# Patient Record
Sex: Female | Born: 1960 | Race: White | Hispanic: No | Marital: Married | Smoking: Former smoker
Health system: Southern US, Community
[De-identification: ages and names within clinical notes are randomized; demographics above are authoritative.]

## PROBLEM LIST (undated history)

## (undated) DIAGNOSIS — I10 Essential (primary) hypertension: Secondary | ICD-10-CM

## (undated) HISTORY — DX: Essential (primary) hypertension: I10

---

## 1997-10-15 ENCOUNTER — Other Ambulatory Visit: Admission: RE | Admit: 1997-10-15 | Discharge: 1997-10-15 | Payer: Self-pay | Admitting: Obstetrics and Gynecology

## 1998-11-20 ENCOUNTER — Other Ambulatory Visit: Admission: RE | Admit: 1998-11-20 | Discharge: 1998-11-20 | Payer: Self-pay | Admitting: Obstetrics and Gynecology

## 1999-02-06 ENCOUNTER — Other Ambulatory Visit: Admission: RE | Admit: 1999-02-06 | Discharge: 1999-02-06 | Payer: Self-pay | Admitting: Obstetrics and Gynecology

## 1999-12-21 ENCOUNTER — Other Ambulatory Visit: Admission: RE | Admit: 1999-12-21 | Discharge: 1999-12-21 | Payer: Self-pay | Admitting: Obstetrics and Gynecology

## 2001-03-08 ENCOUNTER — Other Ambulatory Visit: Admission: RE | Admit: 2001-03-08 | Discharge: 2001-03-08 | Payer: Self-pay | Admitting: Obstetrics and Gynecology

## 2002-03-26 ENCOUNTER — Other Ambulatory Visit: Admission: RE | Admit: 2002-03-26 | Discharge: 2002-03-26 | Payer: Self-pay | Admitting: Obstetrics and Gynecology

## 2003-04-03 ENCOUNTER — Other Ambulatory Visit: Admission: RE | Admit: 2003-04-03 | Discharge: 2003-04-03 | Payer: Self-pay | Admitting: Obstetrics and Gynecology

## 2004-02-10 ENCOUNTER — Encounter: Admission: RE | Admit: 2004-02-10 | Discharge: 2004-02-10 | Payer: Self-pay | Admitting: Obstetrics and Gynecology

## 2004-04-28 ENCOUNTER — Ambulatory Visit: Payer: Self-pay | Admitting: Family Medicine

## 2004-12-29 ENCOUNTER — Ambulatory Visit: Payer: Self-pay | Admitting: Family Medicine

## 2005-02-19 ENCOUNTER — Encounter: Admission: RE | Admit: 2005-02-19 | Discharge: 2005-02-19 | Payer: Self-pay | Admitting: Obstetrics and Gynecology

## 2005-03-04 ENCOUNTER — Encounter: Admission: RE | Admit: 2005-03-04 | Discharge: 2005-03-04 | Payer: Self-pay | Admitting: Gastroenterology

## 2005-03-10 ENCOUNTER — Encounter: Admission: RE | Admit: 2005-03-10 | Discharge: 2005-03-10 | Payer: Self-pay | Admitting: Gastroenterology

## 2006-01-26 ENCOUNTER — Ambulatory Visit: Payer: Self-pay | Admitting: Family Medicine

## 2006-02-22 ENCOUNTER — Encounter: Admission: RE | Admit: 2006-02-22 | Discharge: 2006-02-22 | Payer: Self-pay | Admitting: Obstetrics and Gynecology

## 2006-04-14 ENCOUNTER — Ambulatory Visit: Payer: Self-pay | Admitting: Family Medicine

## 2006-04-14 LAB — CONVERTED CEMR LAB
ALT: 22 units/L (ref 0–40)
AST: 22 units/L (ref 0–37)
Albumin: 4.1 g/dL (ref 3.5–5.2)
Alkaline Phosphatase: 33 units/L — ABNORMAL LOW (ref 39–117)
BUN: 9 mg/dL (ref 6–23)
Basophils Absolute: 0 10*3/uL (ref 0.0–0.1)
Basophils Relative: 0.4 % (ref 0.0–1.0)
CO2: 29 meq/L (ref 19–32)
Calcium: 9.3 mg/dL (ref 8.4–10.5)
Chloride: 100 meq/L (ref 96–112)
Chol/HDL Ratio, serum: 3.3
Cholesterol: 215 mg/dL (ref 0–200)
Creatinine, Ser: 1 mg/dL (ref 0.4–1.2)
Eosinophil percent: 6.6 % — ABNORMAL HIGH (ref 0.0–5.0)
GFR calc non Af Amer: 63 mL/min
Glomerular Filtration Rate, Af Am: 77 mL/min/{1.73_m2}
Glucose, Bld: 83 mg/dL (ref 70–99)
HCT: 42 % (ref 36.0–46.0)
HDL: 65.2 mg/dL (ref >39.0)
Hemoglobin: 14.5 g/dL (ref 12.0–15.0)
LDL DIRECT: 135.6 mg/dL
Lymphocytes Relative: 34.4 % (ref 12.0–46.0)
MCHC: 34.5 g/dL (ref 30.0–36.0)
MCV: 95.1 fL (ref 78.0–100.0)
Monocytes Absolute: 0.4 10*3/uL (ref 0.2–0.7)
Monocytes Relative: 6.7 % (ref 3.0–11.0)
Neutro Abs: 3.4 10*3/uL (ref 1.4–7.7)
Neutrophils Relative %: 51.9 % (ref 43.0–77.0)
Platelets: 362 10*3/uL (ref 150–400)
Potassium: 4.2 meq/L (ref 3.5–5.1)
RBC: 4.42 M/uL (ref 3.87–5.11)
RDW: 12 % (ref 11.5–14.6)
Sodium: 136 meq/L (ref 135–145)
TSH: 2.9 microintl units/mL (ref 0.35–5.50)
Total Bilirubin: 1.1 mg/dL (ref 0.3–1.2)
Total Protein: 7.6 g/dL (ref 6.0–8.3)
Triglyceride fasting, serum: 132 mg/dL (ref 0–149)
VLDL: 26 mg/dL (ref 0–40)
WBC: 6.4 10*3/uL (ref 4.5–10.5)

## 2007-02-08 ENCOUNTER — Ambulatory Visit: Payer: Self-pay | Admitting: Family Medicine

## 2007-02-08 DIAGNOSIS — J029 Acute pharyngitis, unspecified: Secondary | ICD-10-CM

## 2007-02-08 LAB — CONVERTED CEMR LAB
Heterophile Ab Screen: NEGATIVE
Rapid Strep: NEGATIVE

## 2007-02-09 ENCOUNTER — Encounter: Payer: Self-pay | Admitting: Family Medicine

## 2007-02-24 ENCOUNTER — Encounter: Admission: RE | Admit: 2007-02-24 | Discharge: 2007-02-24 | Payer: Self-pay | Admitting: Obstetrics and Gynecology

## 2007-05-05 ENCOUNTER — Ambulatory Visit: Payer: Self-pay | Admitting: Family Medicine

## 2007-05-05 DIAGNOSIS — M549 Dorsalgia, unspecified: Secondary | ICD-10-CM | POA: Insufficient documentation

## 2007-11-21 ENCOUNTER — Telehealth (INDEPENDENT_AMBULATORY_CARE_PROVIDER_SITE_OTHER): Payer: Self-pay | Admitting: *Deleted

## 2007-11-21 ENCOUNTER — Ambulatory Visit: Payer: Self-pay | Admitting: Internal Medicine

## 2007-11-21 DIAGNOSIS — M5430 Sciatica, unspecified side: Secondary | ICD-10-CM

## 2007-11-21 DIAGNOSIS — M545 Low back pain: Secondary | ICD-10-CM

## 2007-11-21 DIAGNOSIS — S35406A Unspecified injury of unspecified renal vein, initial encounter: Secondary | ICD-10-CM

## 2007-12-15 ENCOUNTER — Telehealth (INDEPENDENT_AMBULATORY_CARE_PROVIDER_SITE_OTHER): Payer: Self-pay | Admitting: *Deleted

## 2008-02-26 ENCOUNTER — Encounter: Admission: RE | Admit: 2008-02-26 | Discharge: 2008-02-26 | Payer: Self-pay | Admitting: Obstetrics and Gynecology

## 2008-03-06 ENCOUNTER — Encounter: Admission: RE | Admit: 2008-03-06 | Discharge: 2008-03-06 | Payer: Self-pay | Admitting: Obstetrics and Gynecology

## 2009-02-04 ENCOUNTER — Telehealth (INDEPENDENT_AMBULATORY_CARE_PROVIDER_SITE_OTHER): Payer: Self-pay | Admitting: *Deleted

## 2009-02-10 ENCOUNTER — Encounter (INDEPENDENT_AMBULATORY_CARE_PROVIDER_SITE_OTHER): Payer: Self-pay | Admitting: *Deleted

## 2009-02-17 ENCOUNTER — Ambulatory Visit: Payer: Self-pay | Admitting: Family Medicine

## 2009-02-17 DIAGNOSIS — K219 Gastro-esophageal reflux disease without esophagitis: Secondary | ICD-10-CM

## 2009-02-17 DIAGNOSIS — I10 Essential (primary) hypertension: Secondary | ICD-10-CM | POA: Insufficient documentation

## 2009-02-17 DIAGNOSIS — J45909 Unspecified asthma, uncomplicated: Secondary | ICD-10-CM

## 2009-03-03 ENCOUNTER — Ambulatory Visit: Payer: Self-pay | Admitting: Family Medicine

## 2009-03-04 ENCOUNTER — Encounter: Admission: RE | Admit: 2009-03-04 | Discharge: 2009-03-04 | Payer: Self-pay | Admitting: Obstetrics and Gynecology

## 2009-03-10 ENCOUNTER — Ambulatory Visit: Payer: Self-pay | Admitting: Family Medicine

## 2009-03-10 LAB — CONVERTED CEMR LAB
ALT: 14 units/L (ref 0–35)
AST: 17 units/L (ref 0–37)
Albumin: 3.9 g/dL (ref 3.5–5.2)
Alkaline Phosphatase: 52 units/L (ref 39–117)
BUN: 11 mg/dL (ref 6–23)
Basophils Absolute: 0 10*3/uL (ref 0.0–0.1)
Basophils Relative: 0.4 % (ref 0.0–3.0)
Bilirubin, Direct: 0 mg/dL (ref 0.0–0.3)
CO2: 28 meq/L (ref 19–32)
Calcium: 8.8 mg/dL (ref 8.4–10.5)
Chloride: 107 meq/L (ref 96–112)
Cholesterol: 179 mg/dL (ref 0–200)
Creatinine, Ser: 0.9 mg/dL (ref 0.4–1.2)
Eosinophils Absolute: 0.5 10*3/uL (ref 0.0–0.7)
Eosinophils Relative: 7.8 % — ABNORMAL HIGH (ref 0.0–5.0)
GFR calc non Af Amer: 70.58 mL/min (ref >60)
Glucose, Bld: 86 mg/dL (ref 70–99)
HCT: 38.9 % (ref 36.0–46.0)
HDL: 59 mg/dL (ref >39.00)
Hemoglobin: 13.4 g/dL (ref 12.0–15.0)
LDL Cholesterol: 104 mg/dL — ABNORMAL HIGH (ref 0–99)
Lymphocytes Relative: 38.3 % (ref 12.0–46.0)
Lymphs Abs: 2.3 10*3/uL (ref 0.7–4.0)
MCHC: 34.6 g/dL (ref 30.0–36.0)
MCV: 96.2 fL (ref 78.0–100.0)
Monocytes Absolute: 0.4 10*3/uL (ref 0.1–1.0)
Monocytes Relative: 6.9 % (ref 3.0–12.0)
Neutro Abs: 2.8 10*3/uL (ref 1.4–7.7)
Neutrophils Relative %: 46.6 % (ref 43.0–77.0)
Platelets: 338 10*3/uL (ref 150.0–400.0)
Potassium: 4.1 meq/L (ref 3.5–5.1)
RBC: 4.04 M/uL (ref 3.87–5.11)
RDW: 12.8 % (ref 11.5–14.6)
Sodium: 139 meq/L (ref 135–145)
TSH: 1.76 microintl units/mL (ref 0.35–5.50)
Total Bilirubin: 0.8 mg/dL (ref 0.3–1.2)
Total CHOL/HDL Ratio: 3
Total Protein: 7.5 g/dL (ref 6.0–8.3)
Triglycerides: 78 mg/dL (ref 0.0–149.0)
VLDL: 15.6 mg/dL (ref 0.0–40.0)
WBC: 6 10*3/uL (ref 4.5–10.5)

## 2009-08-05 ENCOUNTER — Ambulatory Visit: Payer: Self-pay | Admitting: Family Medicine

## 2009-08-05 DIAGNOSIS — J301 Allergic rhinitis due to pollen: Secondary | ICD-10-CM

## 2010-03-06 ENCOUNTER — Encounter: Admission: RE | Admit: 2010-03-06 | Discharge: 2010-03-06 | Payer: Self-pay | Admitting: Obstetrics and Gynecology

## 2010-03-13 ENCOUNTER — Telehealth (INDEPENDENT_AMBULATORY_CARE_PROVIDER_SITE_OTHER): Payer: Self-pay | Admitting: *Deleted

## 2010-03-13 ENCOUNTER — Ambulatory Visit: Payer: Self-pay | Admitting: Family Medicine

## 2010-05-15 ENCOUNTER — Telehealth (INDEPENDENT_AMBULATORY_CARE_PROVIDER_SITE_OTHER): Payer: Self-pay | Admitting: *Deleted

## 2010-06-30 NOTE — Assessment & Plan Note (Signed)
Summary: COLD AND CHEST CONGESTION--ADVAIR NOT HELPING///SPH   Vital Signs:  Patient profile:   50 year old female Height:      67.5 inches (171.45 cm) Weight:      151 pounds (68.64 kg) BMI:     23.39 O2 Sat:      98 % on Room air Temp:     98.2 degrees F (36.78 degrees C) oral Pulse rate:   88 / minute BP sitting:   114 / 80  (right arm)  Vitals Entered By: Lucious Groves CMA (March 13, 2010 1:40 PM)  O2 Flow:  Room air CC: C/O cold and congestion x5 days./kb, Cough Is Patient Diabetic? No Pain Assessment Patient in pain? no      Comments Patient notes that she has been having allergy issues, cough with clear-green mucous, sore throat, and SOB. She denies fever and N&V./kb   History of Present Illness:  Cough      This is a 50 year old woman who presents with Cough.  The symptoms began 5 days ago.  The patient reports productive cough, shortness of breath, and wheezing, but denies non-productive cough, pleuritic chest pain, exertional dyspnea, fever, hemoptysis, and malaise.  Associated symtpoms include nasal congestion.  The patient denies the following symptoms: cold/URI symptoms, sore throat, chronic rhinitis, weight loss, acid reflux symptoms, and peripheral edema.  The cough is worse with activity and lying down.  Ineffective prior treatments have included OTC cough medication and other asthma medication.  Risk factors include history of asthma.    Current Medications (verified): 1)  Prilosec Otc 20 Mg  Tbec (Omeprazole Magnesium) .Marland Kitchen.. 1 By Mouth Once Daily. Appointment Due 2)  Advair Diskus 100-50 Mcg/dose  Misc (Fluticasone-Salmeterol) .... As Needed 3)  Vicodin Es 7.5-750 Mg  Tabs (Hydrocodone-Acetaminophen) .Marland Kitchen.. 1 By Mouth Every4- 6 H As Needed 4)  Flexeril 10 Mg  Tabs (Cyclobenzaprine Hcl) .Marland Kitchen.. 1 By Mouth Three Times A Day As Needed 5)  Tramadol Hcl 50 Mg  Tabs (Tramadol Hcl) .Marland Kitchen.. 1-2 Q 6-8 Hrs As Needed 6)  Jolivette 0.35 Mg Tabs (Norethindrone (Contraceptive)) .Marland Kitchen..  1 By Mouth Once Daily. 7)  Lisinopril-Hydrochlorothiazide 10-12.5 Mg Tabs (Lisinopril-Hydrochlorothiazide) .Marland Kitchen.. 1 By Mouth Once Daily 8)  Flonase 50 Mcg/act Susp (Fluticasone Propionate) .... 2 Sprays Each Nostril Once Daily 9)  Allegra 60 Mg Tabs (Fexofenadine Hcl) .Marland Kitchen.. 1 By Mouth Two Times A Day 10)  Augmentin 875-125 Mg Tabs (Amoxicillin-Pot Clavulanate) .Marland Kitchen.. 1 By Mouth Two Times A Day 11)  Prednisone 10 Mg Tabs (Prednisone) .... 3 By Mouth Once Daily For 3 Days Then 2 By Mouth Once Daily For 3 Days Then 1 By Mouth Once Daily For 3 Days 12)  Cheratussin Ac 100-10 Mg/71ml Syrp (Guaifenesin-Codeine) .Marland Kitchen.. 1-2 Tsp By Mouth At Bedtime As Needed  Allergies (verified): 1)  ! Macrodantin  Past History:  Family History: Last updated: 11/21/2007 MOTHER-IVING FATHER-LIVING 1 BROTHER-LIVING (YOUNGER) HEART-FATHER(HEART ATTACK AGE 77) CANCER-P-AUNT,M-UNCLE Family History Hypertension-FATHER Family History High cholesterol-FATHER Family History of Alcoholism/Addiction-FATHER  Risk Factors: Exercise: no (08/05/2009)  Risk Factors: Smoking Status: never (08/05/2009)  Past medical, surgical, family and social histories (including risk factors) reviewed for relevance to current acute and chronic problems.  Past Medical History: Reviewed history from 02/17/2009 and no changes required. Hypertension  Past Surgical History: Reviewed history from 11/21/2007 and no changes required. 2 NATURAL BIRTHS  Family History: Reviewed history from 11/21/2007 and no changes required. MOTHER-IVING FATHER-LIVING 1 BROTHER-LIVING (YOUNGER) HEART-FATHER(HEART ATTACK AGE 77) CANCER-P-AUNT,M-UNCLE Family History  Hypertension-FATHER Family History High cholesterol-FATHER Family History of Alcoholism/Addiction-FATHER  Social History: Reviewed history and no changes required.  Review of Systems      See HPI  Physical Exam  General:  Well-developed,well-nourished,in no acute distress;  alert,appropriate and cooperative throughout examination Ears:  External ear exam shows no significant lesions or deformities.  Otoscopic examination reveals clear canals, tympanic membranes are intact bilaterally without bulging, retraction, inflammation or discharge. Hearing is grossly normal bilaterally. Nose:  External nasal examination shows no deformity or inflammation. Nasal mucosa are pink and moist without lesions or exudates. Mouth:  Oral mucosa and oropharynx without lesions or exudates.  Teeth in good repair. Neck:  No deformities, masses, or tenderness noted. Lungs:  normal respiratory effort, R decreased breath sounds, and L decreased breath sounds.   Heart:  Normal rate and regular rhythm. S1 and S2 normal without gallop, murmur, click, rub or other extra sounds. Extremities:  No clubbing, cyanosis, edema, or deformity noted with normal full range of motion of all joints.   Cervical Nodes:  No lymphadenopathy noted Psych:  Cognition and judgment appear intact. Alert and cooperative with normal attention span and concentration. No apparent delusions, illusions, hallucinations   Impression & Recommendations:  Problem # 1:  BRONCHITIS- ACUTE (ICD-466.0)  Her updated medication list for this problem includes:    Advair Diskus 100-50 Mcg/dose Misc (Fluticasone-salmeterol) .Marland Kitchen... As needed    Augmentin 875-125 Mg Tabs (Amoxicillin-pot clavulanate) .Marland Kitchen... 1 by mouth two times a day    Cheratussin Ac 100-10 Mg/95ml Syrp (Guaifenesin-codeine) .Marland Kitchen... 1-2 tsp by mouth at bedtime as needed  Take antibiotics and other medications as directed. Encouraged to push clear liquids, get enough rest, and take acetaminophen as needed. To be seen in 5-7 days if no improvement, sooner if worse.  Orders: Depo- Medrol 80mg  (J1040) Admin of Therapeutic Inj  intramuscular or subcutaneous (16109) Nebulizer Tx (60454)  Complete Medication List: 1)  Prilosec Otc 20 Mg Tbec (Omeprazole magnesium) .Marland Kitchen.. 1 by  mouth once daily. appointment due 2)  Advair Diskus 100-50 Mcg/dose Misc (Fluticasone-salmeterol) .... As needed 3)  Vicodin Es 7.5-750 Mg Tabs (Hydrocodone-acetaminophen) .Marland Kitchen.. 1 by mouth every4- 6 h as needed 4)  Flexeril 10 Mg Tabs (Cyclobenzaprine hcl) .Marland Kitchen.. 1 by mouth three times a day as needed 5)  Tramadol Hcl 50 Mg Tabs (Tramadol hcl) .Marland Kitchen.. 1-2 q 6-8 hrs as needed 6)  Jolivette 0.35 Mg Tabs (Norethindrone (contraceptive)) .Marland Kitchen.. 1 by mouth once daily. 7)  Lisinopril-hydrochlorothiazide 10-12.5 Mg Tabs (Lisinopril-hydrochlorothiazide) .Marland Kitchen.. 1 by mouth once daily 8)  Flonase 50 Mcg/act Susp (Fluticasone propionate) .... 2 sprays each nostril once daily 9)  Allegra 60 Mg Tabs (Fexofenadine hcl) .Marland Kitchen.. 1 by mouth two times a day 10)  Augmentin 875-125 Mg Tabs (Amoxicillin-pot clavulanate) .Marland Kitchen.. 1 by mouth two times a day 11)  Prednisone 10 Mg Tabs (Prednisone) .... 3 by mouth once daily for 3 days then 2 by mouth once daily for 3 days then 1 by mouth once daily for 3 days 12)  Cheratussin Ac 100-10 Mg/10ml Syrp (Guaifenesin-codeine) .Marland Kitchen.. 1-2 tsp by mouth at bedtime as needed Prescriptions: CHERATUSSIN AC 100-10 MG/5ML SYRP (GUAIFENESIN-CODEINE) 1-2 tsp by mouth at bedtime as needed  #6 oz x 0   Entered and Authorized by:   Loreen Freud DO   Signed by:   Loreen Freud DO on 03/13/2010   Method used:   Print then Give to Patient   RxID:   [id number] PREDNISONE 10 MG TABS (PREDNISONE) 3 by mouth  once daily for 3 days then 2 by mouth once daily for 3 days then 1 by mouth once daily for 3 days  #18 x 0   Entered and Authorized by:   Loreen Freud DO   Signed by:   Loreen Freud DO on 03/13/2010   Method used:   [facility]        [facility] [phone][facility] (retail)       [address]       [place], [place]  [address]       Ph: [phone]       Fax: [phone]   RxID:   [id number] AUGMENTIN 875-125 MG TABS (AMOXICILLIN-POT CLAVULANATE) 1 by mouth two times a  day  #20 x 0   Entered and Authorized by:   Loreen Freud DO   Signed by:   Loreen Freud DO on 03/13/2010   Method used:   [facility] to        [facility] [phone]* (retail)       [address]       [place], [place]  [address]       Ph: [phone]       Fax: [phone]   RxID:   [id number]    Medication Administration  Injection # 1:    Medication: Depo- Medrol 80mg     Diagnosis: BRONCHITIS- ACUTE (ICD-466.0)    Route: IM    Site: LUOQ gluteus    Exp Date: 11/29/2010    Lot #: [id number]    Mfr: Pharmacia    Patient tolerated injection without complications    Given by: Lucious Groves CMA (March 13, 2010 2:30 PM)  Orders Added: 1)  Est. Patient Level IV [83151] 2)  Depo- Medrol 80mg  [J1040] 3)  Admin of Therapeutic Inj  intramuscular or subcutaneous [96372] 4)  Nebulizer Tx [76160]

## 2010-06-30 NOTE — Progress Notes (Signed)
Summary[facility]  Phone Note Call from Patient Call back at Work Phone [phone]   Caller: Patient Summary of Call: [facility]--SAID THAT 2 PEOPLE CHECKED AND SAID HER TWO PRESCRIPTIONS ARE NOT THERE AND SHE DOESNT WANT TO GO THRU THE WEEKEND WITHOUT HER MEDS  PLEASE CALL HER AT [phone] Initial call taken by: Jerolyn Shin,  March 13, 2010 4:25 PM  Follow-up for Phone Call        Called in rx to Pharmacy, they had not recieved. Army Fossa CMA  March 13, 2010 4:51 PM\par  Additional Follow-up for Phone Call Additional follow up Details #1::        Pt is aware we called in meds.  Additional Follow-up by: Army Fossa CMA,  March 13, 2010 4:51 PM

## 2010-06-30 NOTE — Assessment & Plan Note (Signed)
Summary: FOLLOW/UP AND SINUS--PH   Vital Signs:  Patient profile:   50 year old female Weight:      150.38 pounds Temp:     98.8 degrees F oral Pulse rate:   84 / minute Pulse rhythm:   regular BP sitting:   112 / 76  (left arm) Cuff size:   regular  Vitals Entered By: Army Fossa CMA (August 05, 2009 12:55 PM) CC: Pt here to follow up on BP and discuss allergies.   History of Present Illness:  Hypertension follow-up      This is a 50 year old woman who presents for Hypertension follow-up.  The patient denies lightheadedness, urinary frequency, headaches, edema, impotence, rash, and fatigue.  The patient denies the following associated symptoms: chest pain, chest pressure, exercise intolerance, dyspnea, palpitations, syncope, leg edema, and pedal edema.  Compliance with medications (by patient report) has been near 100%.  The patient reports that dietary compliance has been good.  Adjunctive measures currently used by the patient include salt restriction.    Preventive Screening-Counseling & Management  Alcohol-Tobacco     Alcohol type: BEER,WINE     Smoking Status: never  Caffeine-Diet-Exercise     Does Patient Exercise: no  Current Medications (verified): 1)  Prilosec Otc 20 Mg  Tbec (Omeprazole Magnesium) .Marland Kitchen.. 1 By Mouth Once Daily. Appointment Due 2)  Advair Diskus 100-50 Mcg/dose  Misc (Fluticasone-Salmeterol) .... As Needed 3)  Rhinocort Aqua 32 Mcg/act  Susp (Budesonide (Nasal)) .... As Needed 4)  Vicodin Es 7.5-750 Mg  Tabs (Hydrocodone-Acetaminophen) .Marland Kitchen.. 1 By Mouth Every4- 6 H As Needed 5)  Flexeril 10 Mg  Tabs (Cyclobenzaprine Hcl) .Marland Kitchen.. 1 By Mouth Three Times A Day As Needed 6)  Tramadol Hcl 50 Mg  Tabs (Tramadol Hcl) .Marland Kitchen.. 1-2 Q 6-8 Hrs As Needed 7)  Jolivette 0.35 Mg Tabs (Norethindrone (Contraceptive)) .Marland Kitchen.. 1 By Mouth Once Daily. 8)  Lisinopril-Hydrochlorothiazide 10-12.5 Mg Tabs (Lisinopril-Hydrochlorothiazide) .Marland Kitchen.. 1 By Mouth Once Daily 9)  Flonase 50 Mcg/act  Susp (Fluticasone Propionate) .... 2 Sprays Each Nostril Once Daily 10)  Allegra 60 Mg Tabs (Fexofenadine Hcl) .Marland Kitchen.. 1 By Mouth Two Times A Day  Allergies: 1)  ! Macrodantin  Family History: Reviewed history from 11/21/2007 and no changes required. MOTHER-IVING FATHER-LIVING 1 BROTHER-LIVING (YOUNGER) HEART-FATHER(HEART ATTACK AGE 35) CANCER-P-AUNT,M-UNCLE Family History Hypertension-FATHER Family History High cholesterol-FATHER Family History of Alcoholism/Addiction-FATHER  Review of Systems      See HPI  Physical Exam  General:  Well-developed,well-nourished,in no acute distress; alert,appropriate and cooperative throughout examination Lungs:  Normal respiratory effort, chest expands symmetrically. Lungs are clear to auscultation, no crackles or wheezes. Heart:  Normal rate and regular rhythm. S1 and S2 normal without gallop, murmur, click, rub or other extra sounds. Extremities:  No clubbing, cyanosis, edema, or deformity noted with normal full range of motion of all joints.   Psych:  Cognition and judgment appear intact. Alert and cooperative with normal attention span and concentration. No apparent delusions, illusions, hallucinations   Impression & Recommendations:  Problem # 1:  HYPERTENSION (ICD-401.9)  Her updated medication list for this problem includes:    Lisinopril-hydrochlorothiazide 10-12.5 Mg Tabs (Lisinopril-hydrochlorothiazide) .Marland Kitchen... 1 by mouth once daily  BP today: 112/76 Prior BP: 128/82 (03/10/2009)  Labs Reviewed: K+: 4.1 (03/03/2009) Creat: : 0.9 (03/03/2009)   Chol: 179 (03/03/2009)   HDL: 59.00 (03/03/2009)   LDL: 104 (03/03/2009)   TG: 78.0 (03/03/2009)  Problem # 2:  ALLERGIC RHINITIS, SEASONAL (ICD-477.0) flonase allegra  Complete Medication  List: 1)  Prilosec Otc 20 Mg Tbec (Omeprazole magnesium) .Marland Kitchen.. 1 by mouth once daily. appointment due 2)  Advair Diskus 100-50 Mcg/dose Misc (Fluticasone-salmeterol) .... As needed 3)  Rhinocort Aqua 32  Mcg/act Susp (Budesonide (nasal)) .... As needed 4)  Vicodin Es 7.5-750 Mg Tabs (Hydrocodone-acetaminophen) .Marland Kitchen.. 1 by mouth every4- 6 h as needed 5)  Flexeril 10 Mg Tabs (Cyclobenzaprine hcl) .Marland Kitchen.. 1 by mouth three times a day as needed 6)  Tramadol Hcl 50 Mg Tabs (Tramadol hcl) .Marland Kitchen.. 1-2 q 6-8 hrs as needed 7)  Jolivette 0.35 Mg Tabs (Norethindrone (contraceptive)) .Marland Kitchen.. 1 by mouth once daily. 8)  Lisinopril-hydrochlorothiazide 10-12.5 Mg Tabs (Lisinopril-hydrochlorothiazide) .Marland Kitchen.. 1 by mouth once daily 9)  Flonase 50 Mcg/act Susp (Fluticasone propionate) .... 2 sprays each nostril once daily 10)  Allegra 60 Mg Tabs (Fexofenadine hcl) .Marland Kitchen.. 1 by mouth two times a day  Patient Instructions: 1)  rto 6 months or sooner as needed  Prescriptions: TRAMADOL HCL 50 MG  TABS (TRAMADOL HCL) 1-2 q 6-8 hrs as needed  #90 x 1   Entered and Authorized by:   Loreen Freud DO   Signed by:   Loreen Freud DO on 08/05/2009   Method used:   [facility] to        [facility] [phone]* (retail)       [address]       [place], [place]  [address]       Ph: [phone]       Fax: [phone]   RxID:   [id number] ALLEGRA 60 MG TABS (FEXOFENADINE HCL) 1 by mouth two times a day  #60 x 5   Entered and Authorized by:   Loreen Freud DO   Signed by:   Loreen Freud DO on 08/05/2009   Method used:   Electronically to        [facility] [phone]* (retail)       [address]       [place], [place]  [address]       Ph: [phone]       Fax: [phone]   RxID:   [id number] LISINOPRIL-HYDROCHLOROTHIAZIDE 10-12.5 MG TABS (LISINOPRIL-HYDROCHLOROTHIAZIDE) 1 by mouth once daily  #90 x 3   Entered and Authorized by:   Loreen Freud DO   Signed by:   Loreen Freud DO on 08/05/2009   Method used:   [facility]ronically to        [facility] [phone]* (retail)       [address]       [place], [place]  [address]       Ph:  [phone]       Fax: [phone]   RxID:   [id number] PRILOSEC OTC 20 MG  TBEC (OMEPRAZOLE MAGNESIUM) 1 by mouth once daily. APPOINTMENT DUE  #90 x 3   Entered and Authorized by:   Loreen Freud DO   Signed by:   Loreen Freud DO on 08/05/2009   Method used:   Electronically to        [facility] [phone]* (retail)       [address]       [place], [place]  [address]       Ph: [phone]       Fax: [phone]   RxID:  [id number] FLONASE 50 MCG/ACT SUSP (FLUTICASONE PROPIONATE) 2 sprays each nostril once daily  #1 x 5   Entered and Authorized by:   Loreen Freud DO   Signed by:   Loreen Freud DO on 08/05/2009   Method used:   Electronically to        [facility] [phone]* (retail)       [address]       [place], [place]  [address]       Ph: [phone]       Fax: [phone]   RxID:   [id number]

## 2010-07-02 NOTE — Progress Notes (Signed)
Summary: Advair Diskus refill  Phone Note Refill Request Message from:  Fax from Pharmacy on May 15, 2010 1:59 PM  Refills Requested: Medication #1:  ADVAIR DISKUS 100-50 MCG/DOSE  MISC as needed [facility], fax [phone]  Next Appointment Scheduled: none Initial call taken by: Jerolyn Shin,  May 15, 2010 2:00 PM    New/Updated Medications: ADVAIR DISKUS 100-50 MCG/DOSE  MISC (FLUTICASONE-SALMETEROL) 1 puff twice a day as needed Prescriptions: ADVAIR DISKUS 100-50 MCG/DOSE  MISC (FLUTICASONE-SALMETEROL) 1 puff twice a day as needed  #1 x 2   Entered by:   Almeta Monas CMA (AAMA)   Authorized by:   Loreen Freud DO   Signed by:   Almeta Monas CMA (AAMA) on 05/15/2010   Method used:   Printed then faxed to ...       [facility] (mail-order)             , [place]         Ph: [phone]       Fax: [phone]   RxID:   [id number]

## 2010-08-19 ENCOUNTER — Ambulatory Visit (INDEPENDENT_AMBULATORY_CARE_PROVIDER_SITE_OTHER): Payer: BC Managed Care – PPO | Admitting: Family Medicine

## 2010-08-19 ENCOUNTER — Encounter: Payer: Self-pay | Admitting: Family Medicine

## 2010-08-19 DIAGNOSIS — J45909 Unspecified asthma, uncomplicated: Secondary | ICD-10-CM

## 2010-08-19 DIAGNOSIS — K219 Gastro-esophageal reflux disease without esophagitis: Secondary | ICD-10-CM

## 2010-08-19 DIAGNOSIS — I10 Essential (primary) hypertension: Secondary | ICD-10-CM

## 2010-08-19 LAB — CBC WITH DIFFERENTIAL/PLATELET
Basophils Relative: 0.5 % (ref 0.0–3.0)
Eosinophils Absolute: 0.6 10*3/uL (ref 0.0–0.7)
Eosinophils Relative: 8.9 % — ABNORMAL HIGH (ref 0.0–5.0)
HCT: 43.6 % (ref 36.0–46.0)
Hemoglobin: 15.2 g/dL — ABNORMAL HIGH (ref 12.0–15.0)
Lymphocytes Relative: 37 % (ref 12.0–46.0)
Lymphs Abs: 2.6 10*3/uL (ref 0.7–4.0)
MCHC: 34.8 g/dL (ref 30.0–36.0)
MCV: 97.5 fl (ref 78.0–100.0)
Monocytes Absolute: 0.6 10*3/uL (ref 0.1–1.0)
Monocytes Relative: 8.2 % (ref 3.0–12.0)
Neutro Abs: 3.2 10*3/uL (ref 1.4–7.7)
Neutrophils Relative %: 45.4 % (ref 43.0–77.0)
Platelets: 296 10*3/uL (ref 150.0–400.0)
RBC: 4.48 Mil/uL (ref 3.87–5.11)
RDW: 13.2 % (ref 11.5–14.6)
WBC: 7.1 10*3/uL (ref 4.5–10.5)

## 2010-08-19 LAB — POCT URINALYSIS DIPSTICK
Bilirubin, UA: NEGATIVE
Blood, UA: NEGATIVE
Glucose, UA: NEGATIVE
Ketones, UA: NEGATIVE
Leukocytes, UA: NEGATIVE
pH, UA: 5

## 2010-08-19 LAB — BASIC METABOLIC PANEL
BUN: 12 mg/dL (ref 6–23)
CO2: 27 mEq/L (ref 19–32)
Calcium: 9 mg/dL (ref 8.4–10.5)
Chloride: 99 mEq/L (ref 96–112)
Creatinine, Ser: 0.9 mg/dL (ref 0.4–1.2)
GFR: 70.17 mL/min (ref >60.00)
Glucose, Bld: 78 mg/dL (ref 70–99)
Potassium: 4.1 mEq/L (ref 3.5–5.1)
Sodium: 135 mEq/L (ref 135–145)

## 2010-08-19 LAB — HEPATIC FUNCTION PANEL
ALT: 13 U/L (ref 0–35)
AST: 18 U/L (ref 0–37)
Albumin: 4.6 g/dL (ref 3.5–5.2)
Alkaline Phosphatase: 44 U/L (ref 39–117)
Bilirubin, Direct: 0.2 mg/dL (ref 0.0–0.3)
Total Bilirubin: 1.1 mg/dL (ref 0.3–1.2)
Total Protein: 7.6 g/dL (ref 6.0–8.3)

## 2010-08-19 LAB — LIPID PANEL
Cholesterol: 209 mg/dL — ABNORMAL HIGH (ref 0–200)
HDL: 65.5 mg/dL (ref >39.00)
Total CHOL/HDL Ratio: 3
Triglycerides: 60 mg/dL (ref 0.0–149.0)
VLDL: 12 mg/dL (ref 0.0–40.0)

## 2010-08-19 LAB — LDL CHOLESTEROL, DIRECT: Direct LDL: 125.6 mg/dL

## 2010-08-19 LAB — TSH: TSH: 2.96 u[IU]/mL (ref 0.35–5.50)

## 2010-08-19 MED ORDER — OMEPRAZOLE 20 MG PO CPDR: 20.0000 mg | DELAYED_RELEASE_CAPSULE | Freq: Every day | ORAL | Status: DC

## 2010-08-19 MED ORDER — LISINOPRIL-HYDROCHLOROTHIAZIDE 10-12.5 MG PO TABS: 1.0000 | ORAL_TABLET | Freq: Every day | ORAL | Status: DC

## 2010-08-19 MED ORDER — FLUTICASONE-SALMETEROL 100-50 MCG/DOSE IN AEPB: 1.0000 | INHALATION_SPRAY | Freq: Two times a day (BID) | RESPIRATORY_TRACT | Status: DC

## 2010-08-19 NOTE — Patient Instructions (Signed)
Return to office for physical

## 2010-08-19 NOTE — Assessment & Plan Note (Signed)
Refill meds

## 2010-08-19 NOTE — Progress Notes (Signed)
  Subjective:    Patient ID: Tina Petersen, female    DOB: 20-May-1961, 50 y.o.   MRN: [id number]  HPI Pt is here for f/u HTN and med renewal.   No complaints.      Review of Systems  Constitutional: Negative.   Respiratory: Negative for cough, chest tightness and shortness of breath.   Cardiovascular: Negative for chest pain, palpitations and leg swelling.  Psychiatric/Behavioral: Negative for dysphoric mood and decreased concentration.       Objective:   Physical Exam  Constitutional: She is oriented to person, place, and time. She appears well-developed and well-nourished.  HENT:  Head: Normocephalic.  Mouth/Throat: Oropharynx is clear and moist.  Eyes: Pupils are equal, round, and reactive to light.  Neck: Normal range of motion. Neck supple.  Cardiovascular: Normal rate, regular rhythm and normal heart sounds.   Pulmonary/Chest: Effort normal and breath sounds normal. No respiratory distress. She has no wheezes. She has no rales. She exhibits no tenderness.  Musculoskeletal: She exhibits no edema.  Neurological: She is alert and oriented to person, place, and time.  Psychiatric: She has a normal mood and affect.          Assessment & Plan:

## 2010-08-19 NOTE — Progress Notes (Signed)
Addended by: Floydene Flock on: 08/19/2010 01:17 PM   Modules accepted: Orders

## 2010-08-19 NOTE — Assessment & Plan Note (Signed)
con't meds Check labs  rto cpe 

## 2010-08-20 NOTE — Progress Notes (Signed)
Letter mailed     KP 

## 2010-10-27 ENCOUNTER — Other Ambulatory Visit: Payer: Self-pay | Admitting: Family Medicine

## 2010-10-27 DIAGNOSIS — K219 Gastro-esophageal reflux disease without esophagitis: Secondary | ICD-10-CM

## 2010-10-27 MED ORDER — OMEPRAZOLE 20 MG PO CPDR: 20.0000 mg | DELAYED_RELEASE_CAPSULE | Freq: Every day | ORAL | Status: DC

## 2010-10-27 NOTE — Telephone Encounter (Signed)
Done

## 2010-10-30 ENCOUNTER — Other Ambulatory Visit: Payer: Self-pay | Admitting: Family Medicine

## 2010-10-30 DIAGNOSIS — K219 Gastro-esophageal reflux disease without esophagitis: Secondary | ICD-10-CM

## 2010-10-30 MED ORDER — OMEPRAZOLE 20 MG PO CPDR: 20.0000 mg | DELAYED_RELEASE_CAPSULE | Freq: Every day | ORAL | Status: DC

## 2010-10-30 NOTE — Telephone Encounter (Signed)
Rx faxed.    KP 

## 2010-11-13 ENCOUNTER — Encounter: Payer: Self-pay | Admitting: Family Medicine

## 2010-11-19 ENCOUNTER — Encounter: Payer: Self-pay | Admitting: Family Medicine

## 2010-11-19 ENCOUNTER — Ambulatory Visit (INDEPENDENT_AMBULATORY_CARE_PROVIDER_SITE_OTHER): Payer: BC Managed Care – PPO | Admitting: Family Medicine

## 2010-11-19 VITALS — BP 118/80 | HR 75 | Temp 98.1°F | Ht 68.5 in | Wt 150.8 lb

## 2010-11-19 DIAGNOSIS — M549 Dorsalgia, unspecified: Secondary | ICD-10-CM

## 2010-11-19 DIAGNOSIS — Z0000 Encounter for general adult medical examination without abnormal findings: Secondary | ICD-10-CM

## 2010-11-19 MED ORDER — TRAMADOL HCL 50 MG PO TABS: 50.0000 mg | ORAL_TABLET | Freq: Three times a day (TID) | ORAL | Status: DC | PRN

## 2010-11-19 NOTE — Patient Instructions (Signed)

## 2010-11-19 NOTE — Progress Notes (Signed)
  Subjective:     Tina Petersen is a 50 y.o. female and is here for a comprehensive physical exam. The patient reports no problems.  History   Social History  . Marital Status: Married    Spouse Name: N/A    Number of Children: 2  . Years of Education: N/A   Occupational History  . realtor    Social History Main Topics  . Smoking status: Never Smoker   . Smokeless tobacco: Not on file  . Alcohol Use: 4.2 oz/week    7 Glasses of wine per week  . Drug Use: No  . Sexually Active: Yes -- Female partner(s)   Other Topics Concern  . Not on file   Social History Narrative  . No narrative on file   Health Maintenance  Topic Date Due  . Tetanus/tdap  09/01/1978  . Colonoscopy  08/31/2009  . Influenza Vaccine  03/01/2011  . Mammogram  03/06/2012  . Pap Smear  03/20/2013    The following portions of the patient's history were reviewed and updated as appropriate: allergies, current medications, past family history, past medical history, past social history, past surgical history and problem list.  Review of Systems Review of Systems  Constitutional: Negative for activity change, appetite change and fatigue.  HENT: Negative for hearing loss, congestion, tinnitus and ear discharge.  dentist q11m Eyes: Negative for visual disturbance (see optho q1y -- vision corrected to 20/20 with glasses).  Respiratory: Negative for cough, chest tightness and shortness of breath.   Cardiovascular: Negative for chest pain, palpitations and leg swelling.  Gastrointestinal: Negative for abdominal pain, diarrhea, constipation and abdominal distention.  Genitourinary: Negative for urgency, frequency, decreased urine volume and difficulty urinating.  Musculoskeletal: Negative for back pain, arthralgias and gait problem.  Skin: Negative for color change, pallor and rash.  Neurological: Negative for dizziness, light-headedness, numbness and headaches.  Hematological: Negative for adenopathy. Does not  bruise/bleed easily.  Psychiatric/Behavioral: Negative for suicidal ideas, confusion, sleep disturbance, self-injury, dysphoric mood, decreased concentration and agitation.       Objective:    BP 118/80  Pulse 75  Temp(Src) 98.1 F (36.7 C) (Oral)  Ht 5' 8.5" (1.74 m)  Wt 150 lb 12.8 oz (68.402 kg)  BMI 22.60 kg/m2  SpO2 99% General appearance: alert, cooperative, appears stated age and no distress Head: Normocephalic, without obvious abnormality, atraumatic Eyes: conjunctivae/corneas clear. PERRL, EOM's intact. Fundi benign. Ears: normal TM's and external ear canals both ears Nose: Nares normal. Septum midline. Mucosa normal. No drainage or sinus tenderness. Throat: lips, mucosa, and tongue normal; teeth and gums normal Neck: no adenopathy, no carotid bruit, no JVD, supple, symmetrical, trachea midline and thyroid not enlarged, symmetric, no tenderness/mass/nodules Lungs: clear to auscultation bilaterally Breasts: gyn Heart: regular rate and rhythm, S1, S2 normal, no murmur, click, rub or gallop Abdomen: soft, non-tender; bowel sounds normal; no masses,  no organomegaly Pelvic: gyn Extremities: extremities normal, atraumatic, no cyanosis or edema Pulses: 2+ and symmetric Skin: Skin color, texture, turgor normal. No rashes or lesions Lymph nodes: Cervical, supraclavicular, and axillary nodes normal. Neurologic: Grossly normal psych--no anxiety, no depression, not suicidal    Assessment:    Healthy female exam.   htn Plan:     See After Visit Summary for Counseling Recommendations

## 2011-03-09 ENCOUNTER — Other Ambulatory Visit: Payer: Self-pay | Admitting: Obstetrics and Gynecology

## 2011-03-09 DIAGNOSIS — Z1231 Encounter for screening mammogram for malignant neoplasm of breast: Secondary | ICD-10-CM

## 2011-03-18 ENCOUNTER — Ambulatory Visit
Admission: RE | Admit: 2011-03-18 | Discharge: 2011-03-18 | Disposition: A | Discharge status: Home / Self Care | Payer: BC Managed Care – PPO | Source: Ambulatory Visit | Attending: Obstetrics and Gynecology | Admitting: Obstetrics and Gynecology

## 2011-03-18 DIAGNOSIS — Z1231 Encounter for screening mammogram for malignant neoplasm of breast: Secondary | ICD-10-CM

## 2011-09-13 ENCOUNTER — Other Ambulatory Visit: Payer: Self-pay | Admitting: *Deleted

## 2011-09-13 DIAGNOSIS — J45909 Unspecified asthma, uncomplicated: Secondary | ICD-10-CM

## 2011-09-13 DIAGNOSIS — I10 Essential (primary) hypertension: Secondary | ICD-10-CM

## 2011-09-13 MED ORDER — LISINOPRIL-HYDROCHLOROTHIAZIDE 10-12.5 MG PO TABS: 1.0000 | ORAL_TABLET | Freq: Every day | ORAL | Status: DC

## 2011-09-13 MED ORDER — FLUTICASONE-SALMETEROL 100-50 MCG/DOSE IN AEPB: 1.0000 | INHALATION_SPRAY | Freq: Two times a day (BID) | RESPIRATORY_TRACT | Status: DC

## 2011-09-13 NOTE — Telephone Encounter (Signed)
Rx sent 

## 2012-02-17 ENCOUNTER — Other Ambulatory Visit: Payer: Self-pay | Admitting: Obstetrics and Gynecology

## 2012-02-17 DIAGNOSIS — Z1231 Encounter for screening mammogram for malignant neoplasm of breast: Secondary | ICD-10-CM

## 2012-03-01 ENCOUNTER — Other Ambulatory Visit: Payer: Self-pay | Admitting: Family Medicine

## 2012-03-02 NOTE — Telephone Encounter (Signed)
Last seen and filled 11/19/10 #90 with 3 refills. CPE is scheduled for 06/02/12. Please advise     KP

## 2012-03-02 NOTE — Telephone Encounter (Signed)
Needs ov  --- only 1 month

## 2012-03-02 NOTE — Addendum Note (Signed)
Addended by: Mal Amabile on: 03/02/2012 04:53 PM   Modules accepted: Orders

## 2012-03-02 NOTE — Telephone Encounter (Signed)
Spoke with pt she is requesting this Rx also. Pt asked if appt in January for her annual is soon enough since you advise need OV? Plz advise          MW

## 2012-03-03 NOTE — Telephone Encounter (Signed)
Spoke to husband since wife in shower advising him we will see his wife in January no need to come in before for extra office visit and Rx sent. Pt husband stated he would tell pt.      MW

## 2012-03-20 ENCOUNTER — Ambulatory Visit
Admission: RE | Admit: 2012-03-20 | Discharge: 2012-03-20 | Disposition: A | Discharge status: Home / Self Care | Payer: BC Managed Care – PPO | Source: Ambulatory Visit | Attending: Obstetrics and Gynecology | Admitting: Obstetrics and Gynecology

## 2012-03-20 DIAGNOSIS — Z1231 Encounter for screening mammogram for malignant neoplasm of breast: Secondary | ICD-10-CM

## 2012-05-02 ENCOUNTER — Other Ambulatory Visit: Payer: Self-pay | Admitting: Family Medicine

## 2012-05-02 NOTE — Telephone Encounter (Signed)
Rx sent.  Pt needs appt last OV 11/19/10 PLz schedule.    MW

## 2012-05-02 NOTE — Telephone Encounter (Signed)
Patient already has CPE scheduled for 1.3.14

## 2012-06-01 ENCOUNTER — Encounter: Payer: Self-pay | Admitting: Lab

## 2012-06-02 ENCOUNTER — Encounter: Payer: Self-pay | Admitting: Family Medicine

## 2012-06-02 ENCOUNTER — Ambulatory Visit (INDEPENDENT_AMBULATORY_CARE_PROVIDER_SITE_OTHER): Payer: BC Managed Care – PPO | Admitting: Family Medicine

## 2012-06-02 VITALS — BP 120/78 | HR 74 | Temp 98.6°F | Ht 67.5 in | Wt 156.8 lb

## 2012-06-02 DIAGNOSIS — I10 Essential (primary) hypertension: Secondary | ICD-10-CM

## 2012-06-02 DIAGNOSIS — Z0000 Encounter for general adult medical examination without abnormal findings: Secondary | ICD-10-CM

## 2012-06-02 DIAGNOSIS — J45909 Unspecified asthma, uncomplicated: Secondary | ICD-10-CM

## 2012-06-02 DIAGNOSIS — Z9109 Other allergy status, other than to drugs and biological substances: Secondary | ICD-10-CM

## 2012-06-02 LAB — CBC WITH DIFFERENTIAL/PLATELET
Basophils Absolute: 0 10*3/uL (ref 0.0–0.1)
HCT: 42.8 % (ref 36.0–46.0)
Lymphs Abs: 2.3 10*3/uL (ref 0.7–4.0)
Monocytes Absolute: 0.4 10*3/uL (ref 0.1–1.0)
Monocytes Relative: 6.4 % (ref 3.0–12.0)
Neutrophils Relative %: 54.2 % (ref 43.0–77.0)
Platelets: 307 10*3/uL (ref 150.0–400.0)
RDW: 13.6 % (ref 11.5–14.6)

## 2012-06-02 LAB — LIPID PANEL
Cholesterol: 199 mg/dL (ref 0–200)
LDL Cholesterol: 112 mg/dL — ABNORMAL HIGH (ref 0–99)
Triglycerides: 120 mg/dL (ref 0.0–149.0)
VLDL: 24 mg/dL (ref 0.0–40.0)

## 2012-06-02 LAB — POCT URINALYSIS DIPSTICK
Blood, UA: NEGATIVE
Protein, UA: NEGATIVE
Spec Grav, UA: 1.015
Urobilinogen, UA: 0.2
pH, UA: 6

## 2012-06-02 LAB — HEPATIC FUNCTION PANEL
AST: 21 U/L (ref 0–37)
Bilirubin, Direct: 0 mg/dL (ref 0.0–0.3)
Total Bilirubin: 1 mg/dL (ref 0.3–1.2)

## 2012-06-02 LAB — BASIC METABOLIC PANEL
BUN: 10 mg/dL (ref 6–23)
Creatinine, Ser: 0.8 mg/dL (ref 0.4–1.2)
GFR: 80.99 mL/min (ref >60.00)
Glucose, Bld: 88 mg/dL (ref 70–99)
Potassium: 3.5 mEq/L (ref 3.5–5.1)

## 2012-06-02 MED ORDER — FLUTICASONE PROPIONATE 50 MCG/ACT NA SUSP: 2.0000 | Freq: Every day | NASAL | Status: DC

## 2012-06-02 MED ORDER — ALBUTEROL SULFATE HFA 108 (90 BASE) MCG/ACT IN AERS: 2.0000 | INHALATION_SPRAY | Freq: Four times a day (QID) | RESPIRATORY_TRACT | Status: DC | PRN

## 2012-06-02 MED ORDER — LISINOPRIL-HYDROCHLOROTHIAZIDE 10-12.5 MG PO TABS: ORAL_TABLET | ORAL | Status: DC

## 2012-06-02 MED ORDER — FLUTICASONE-SALMETEROL 100-50 MCG/DOSE IN AEPB: 1.0000 | INHALATION_SPRAY | Freq: Two times a day (BID) | RESPIRATORY_TRACT | Status: DC

## 2012-06-02 MED ORDER — FLUTICASONE PROPIONATE 50 MCG/ACT NA SUSP: 2.0000 | Freq: Every day | NASAL | Status: AC

## 2012-06-02 NOTE — Assessment & Plan Note (Signed)
Refill inhalers.

## 2012-06-02 NOTE — Patient Instructions (Addendum)
Preventive Care for Adults, Female A healthy lifestyle and preventive care can promote health and wellness. Preventive health guidelines for women include the following key practices.  A routine yearly physical is a good way to check with your caregiver about your health and preventive screening. It is a chance to share any concerns and updates on your health, and to receive a thorough exam.  Visit your dentist for a routine exam and preventive care every 6 months. Brush your teeth twice a day and floss once a day. Good oral hygiene prevents tooth decay and gum disease.  The frequency of eye exams is based on your age, health, family medical history, use of contact lenses, and other factors. Follow your caregiver's recommendations for frequency of eye exams.  Eat a healthy diet. Foods like vegetables, fruits, whole grains, low-fat dairy products, and lean protein foods contain the nutrients you need without too many calories. Decrease your intake of foods high in solid fats, added sugars, and salt. Eat the right amount of calories for you.Get information about a proper diet from your caregiver, if necessary.  Regular physical exercise is one of the most important things you can do for your health. Most adults should get at least 150 minutes of moderate-intensity exercise (any activity that increases your heart rate and causes you to sweat) each week. In addition, most adults need muscle-strengthening exercises on 2 or more days a week.  Maintain a healthy weight. The body mass index (BMI) is a screening tool to identify possible weight problems. It provides an estimate of body fat based on height and weight. Your caregiver can help determine your BMI, and can help you achieve or maintain a healthy weight.For adults 20 years and older:  A BMI below 18.5 is considered underweight.  A BMI of 18.5 to 24.9 is normal.  A BMI of 25 to 29.9 is considered overweight.  A BMI of 30 and above is  considered obese.  Maintain normal blood lipids and cholesterol levels by exercising and minimizing your intake of saturated fat. Eat a balanced diet with plenty of fruit and vegetables. Blood tests for lipids and cholesterol should begin at age 20 and be repeated every 5 years. If your lipid or cholesterol levels are high, you are over 50, or you are at high risk for heart disease, you may need your cholesterol levels checked more frequently.Ongoing high lipid and cholesterol levels should be treated with medicines if diet and exercise are not effective.  If you smoke, find out from your caregiver how to quit. If you do not use tobacco, do not start.  If you are pregnant, do not drink alcohol. If you are breastfeeding, be very cautious about drinking alcohol. If you are not pregnant and choose to drink alcohol, do not exceed 1 drink per day. One drink is considered to be 12 ounces (355 mL) of beer, 5 ounces (148 mL) of wine, or 1.5 ounces (44 mL) of liquor.  Avoid use of street drugs. Do not share needles with anyone. Ask for help if you need support or instructions about stopping the use of drugs.  High blood pressure causes heart disease and increases the risk of stroke. Your blood pressure should be checked at least every 1 to 2 years. Ongoing high blood pressure should be treated with medicines if weight loss and exercise are not effective.  If you are 55 to 53 years old, ask your caregiver if you should take aspirin to prevent strokes.  Diabetes   screening involves taking a blood sample to check your fasting blood sugar level. This should be done once every 3 years, after age 45, if you are within normal weight and without risk factors for diabetes. Testing should be considered at a younger age or be carried out more frequently if you are overweight and have at least 1 risk factor for diabetes.  Breast cancer screening is essential preventive care for women. You should practice "breast  self-awareness." This means understanding the normal appearance and feel of your breasts and may include breast self-examination. Any changes detected, no matter how small, should be reported to a caregiver. Women in their 20s and 30s should have a clinical breast exam (CBE) by a caregiver as part of a regular health exam every 1 to 3 years. After age 40, women should have a CBE every year. Starting at age 40, women should consider having a mammography (breast X-ray test) every year. Women who have a family history of breast cancer should talk to their caregiver about genetic screening. Women at a high risk of breast cancer should talk to their caregivers about having magnetic resonance imaging (MRI) and a mammography every year.  The Pap test is a screening test for cervical cancer. A Pap test can show cell changes on the cervix that might become cervical cancer if left untreated. A Pap test is a procedure in which cells are obtained and examined from the lower end of the uterus (cervix).  Women should have a Pap test starting at age 21.  Between ages 21 and 29, Pap tests should be repeated every 2 years.  Beginning at age 30, you should have a Pap test every 3 years as long as the past 3 Pap tests have been normal.  Some women have medical problems that increase the chance of getting cervical cancer. Talk to your caregiver about these problems. It is especially important to talk to your caregiver if a new problem develops soon after your last Pap test. In these cases, your caregiver may recommend more frequent screening and Pap tests.  The above recommendations are the same for women who have or have not gotten the vaccine for human papillomavirus (HPV).  If you had a hysterectomy for a problem that was not cancer or a condition that could lead to cancer, then you no longer need Pap tests. Even if you no longer need a Pap test, a regular exam is a good idea to make sure no other problems are  starting.  If you are between ages 65 and 70, and you have had normal Pap tests going back 10 years, you no longer need Pap tests. Even if you no longer need a Pap test, a regular exam is a good idea to make sure no other problems are starting.  If you have had past treatment for cervical cancer or a condition that could lead to cancer, you need Pap tests and screening for cancer for at least 20 years after your treatment.  If Pap tests have been discontinued, risk factors (such as a new sexual partner) need to be reassessed to determine if screening should be resumed.  The HPV test is an additional test that may be used for cervical cancer screening. The HPV test looks for the virus that can cause the cell changes on the cervix. The cells collected during the Pap test can be tested for HPV. The HPV test could be used to screen women aged 30 years and older, and should   be used in women of any age who have unclear Pap test results. After the age of 30, women should have HPV testing at the same frequency as a Pap test.  Colorectal cancer can be detected and often prevented. Most routine colorectal cancer screening begins at the age of 50 and continues through age 75. However, your caregiver may recommend screening at an earlier age if you have risk factors for colon cancer. On a yearly basis, your caregiver may provide home test kits to check for hidden blood in the stool. Use of a small camera at the end of a tube, to directly examine the colon (sigmoidoscopy or colonoscopy), can detect the earliest forms of colorectal cancer. Talk to your caregiver about this at age 50, when routine screening begins. Direct examination of the colon should be repeated every 5 to 10 years through age 75, unless early forms of pre-cancerous polyps or small growths are found.  Hepatitis C blood testing is recommended for all people born from 1945 through 1965 and any individual with known risks for hepatitis C.  Practice  safe sex. Use condoms and avoid high-risk sexual practices to reduce the spread of sexually transmitted infections (STIs). STIs include gonorrhea, chlamydia, syphilis, trichomonas, herpes, HPV, and human immunodeficiency virus (HIV). Herpes, HIV, and HPV are viral illnesses that have no cure. They can result in disability, cancer, and death. Sexually active women aged 25 and younger should be checked for chlamydia. Older women with new or multiple partners should also be tested for chlamydia. Testing for other STIs is recommended if you are sexually active and at increased risk.  Osteoporosis is a disease in which the bones lose minerals and strength with aging. This can result in serious bone fractures. The risk of osteoporosis can be identified using a bone density scan. Women ages 65 and over and women at risk for fractures or osteoporosis should discuss screening with their caregivers. Ask your caregiver whether you should take a calcium supplement or vitamin D to reduce the rate of osteoporosis.  Menopause can be associated with physical symptoms and risks. Hormone replacement therapy is available to decrease symptoms and risks. You should talk to your caregiver about whether hormone replacement therapy is right for you.  Use sunscreen with sun protection factor (SPF) of 30 or more. Apply sunscreen liberally and repeatedly throughout the day. You should seek shade when your shadow is shorter than you. Protect yourself by wearing long sleeves, pants, a wide-brimmed hat, and sunglasses year round, whenever you are outdoors.  Once a month, do a whole body skin exam, using a mirror to look at the skin on your back. Notify your caregiver of new moles, moles that have irregular borders, moles that are larger than a pencil eraser, or moles that have changed in shape or color.  Stay current with required immunizations.  Influenza. You need a dose every fall (or winter). The composition of the flu vaccine  changes each year, so being vaccinated once is not enough.  Pneumococcal polysaccharide. You need 1 to 2 doses if you smoke cigarettes or if you have certain chronic medical conditions. You need 1 dose at age 65 (or older) if you have never been vaccinated.  Tetanus, diphtheria, pertussis (Tdap, Td). Get 1 dose of Tdap vaccine if you are younger than age 65, are over 65 and have contact with an infant, are a healthcare worker, are pregnant, or simply want to be protected from whooping cough. After that, you need a Td   booster dose every 10 years. Consult your caregiver if you have not had at least 3 tetanus and diphtheria-containing shots sometime in your life or have a deep or dirty wound.  HPV. You need this vaccine if you are a woman age 26 or younger. The vaccine is given in 3 doses over 6 months.  Measles, mumps, rubella (MMR). You need at least 1 dose of MMR if you were born in 1957 or later. You may also need a second dose.  Meningococcal. If you are age 19 to 21 and a first-year college student living in a residence hall, or have one of several medical conditions, you need to get vaccinated against meningococcal disease. You may also need additional booster doses.  Zoster (shingles). If you are age 60 or older, you should get this vaccine.  Varicella (chickenpox). If you have never had chickenpox or you were vaccinated but received only 1 dose, talk to your caregiver to find out if you need this vaccine.  Hepatitis A. You need this vaccine if you have a specific risk factor for hepatitis A virus infection or you simply wish to be protected from this disease. The vaccine is usually given as 2 doses, 6 to 18 months apart.  Hepatitis B. You need this vaccine if you have a specific risk factor for hepatitis B virus infection or you simply wish to be protected from this disease. The vaccine is given in 3 doses, usually over 6 months. Preventive Services / Frequency Ages 19 to 39  Blood  pressure check.** / Every 1 to 2 years.  Lipid and cholesterol check.** / Every 5 years beginning at age 20.  Clinical breast exam.** / Every 3 years for women in their 20s and 30s.  Pap test.** / Every 2 years from ages 21 through 29. Every 3 years starting at age 30 through age 65 or 70 with a history of 3 consecutive normal Pap tests.  HPV screening.** / Every 3 years from ages 30 through ages 65 to 70 with a history of 3 consecutive normal Pap tests.  Hepatitis C blood test.** / For any individual with known risks for hepatitis C.  Skin self-exam. / Monthly.  Influenza immunization.** / Every year.  Pneumococcal polysaccharide immunization.** / 1 to 2 doses if you smoke cigarettes or if you have certain chronic medical conditions.  Tetanus, diphtheria, pertussis (Tdap, Td) immunization. / A one-time dose of Tdap vaccine. After that, you need a Td booster dose every 10 years.  HPV immunization. / 3 doses over 6 months, if you are 26 and younger.  Measles, mumps, rubella (MMR) immunization. / You need at least 1 dose of MMR if you were born in 1957 or later. You may also need a second dose.  Meningococcal immunization. / 1 dose if you are age 19 to 21 and a first-year college student living in a residence hall, or have one of several medical conditions, you need to get vaccinated against meningococcal disease. You may also need additional booster doses.  Varicella immunization.** / Consult your caregiver.  Hepatitis A immunization.** / Consult your caregiver. 2 doses, 6 to 18 months apart.  Hepatitis B immunization.** / Consult your caregiver. 3 doses usually over 6 months. Ages 40 to 64  Blood pressure check.** / Every 1 to 2 years.  Lipid and cholesterol check.** / Every 5 years beginning at age 20.  Clinical breast exam.** / Every year after age 40.  Mammogram.** / Every year beginning at age 40   and continuing for as long as you are in good health. Consult with your  caregiver.  Pap test.** / Every 3 years starting at age 30 through age 65 or 70 with a history of 3 consecutive normal Pap tests.  HPV screening.** / Every 3 years from ages 30 through ages 65 to 70 with a history of 3 consecutive normal Pap tests.  Fecal occult blood test (FOBT) of stool. / Every year beginning at age 50 and continuing until age 75. You may not need to do this test if you get a colonoscopy every 10 years.  Flexible sigmoidoscopy or colonoscopy.** / Every 5 years for a flexible sigmoidoscopy or every 10 years for a colonoscopy beginning at age 50 and continuing until age 75.  Hepatitis C blood test.** / For all people born from 1945 through 1965 and any individual with known risks for hepatitis C.  Skin self-exam. / Monthly.  Influenza immunization.** / Every year.  Pneumococcal polysaccharide immunization.** / 1 to 2 doses if you smoke cigarettes or if you have certain chronic medical conditions.  Tetanus, diphtheria, pertussis (Tdap, Td) immunization.** / A one-time dose of Tdap vaccine. After that, you need a Td booster dose every 10 years.  Measles, mumps, rubella (MMR) immunization. / You need at least 1 dose of MMR if you were born in 1957 or later. You may also need a second dose.  Varicella immunization.** / Consult your caregiver.  Meningococcal immunization.** / Consult your caregiver.  Hepatitis A immunization.** / Consult your caregiver. 2 doses, 6 to 18 months apart.  Hepatitis B immunization.** / Consult your caregiver. 3 doses, usually over 6 months. Ages 65 and over  Blood pressure check.** / Every 1 to 2 years.  Lipid and cholesterol check.** / Every 5 years beginning at age 20.  Clinical breast exam.** / Every year after age 40.  Mammogram.** / Every year beginning at age 40 and continuing for as long as you are in good health. Consult with your caregiver.  Pap test.** / Every 3 years starting at age 30 through age 65 or 70 with a 3  consecutive normal Pap tests. Testing can be stopped between 65 and 70 with 3 consecutive normal Pap tests and no abnormal Pap or HPV tests in the past 10 years.  HPV screening.** / Every 3 years from ages 30 through ages 65 or 70 with a history of 3 consecutive normal Pap tests. Testing can be stopped between 65 and 70 with 3 consecutive normal Pap tests and no abnormal Pap or HPV tests in the past 10 years.  Fecal occult blood test (FOBT) of stool. / Every year beginning at age 50 and continuing until age 75. You may not need to do this test if you get a colonoscopy every 10 years.  Flexible sigmoidoscopy or colonoscopy.** / Every 5 years for a flexible sigmoidoscopy or every 10 years for a colonoscopy beginning at age 50 and continuing until age 75.  Hepatitis C blood test.** / For all people born from 1945 through 1965 and any individual with known risks for hepatitis C.  Osteoporosis screening.** / A one-time screening for women ages 65 and over and women at risk for fractures or osteoporosis.  Skin self-exam. / Monthly.  Influenza immunization.** / Every year.  Pneumococcal polysaccharide immunization.** / 1 dose at age 65 (or older) if you have never been vaccinated.  Tetanus, diphtheria, pertussis (Tdap, Td) immunization. / A one-time dose of Tdap vaccine if you are over   65 and have contact with an infant, are a healthcare worker, or simply want to be protected from whooping cough. After that, you need a Td booster dose every 10 years.  Varicella immunization.** / Consult your caregiver.  Meningococcal immunization.** / Consult your caregiver.  Hepatitis A immunization.** / Consult your caregiver. 2 doses, 6 to 18 months apart.  Hepatitis B immunization.** / Check with your caregiver. 3 doses, usually over 6 months. ** Family history and personal history of risk and conditions may change your caregiver's recommendations. Document Released: 07/13/2001 Document Revised: 08/09/2011  Document Reviewed: 10/12/2010 [employer] Patient Information 2013 [employer].  

## 2012-06-02 NOTE — Assessment & Plan Note (Signed)
Stable con't meds 

## 2012-06-02 NOTE — Progress Notes (Signed)
Subjective:     Tina Petersen is a 52 y.o. female and is here for a comprehensive physical exam. The patient reports no problems.  History   Social History  . Marital Status: Married    Spouse Name: N/A    Number of Children: 2  . Years of Education: N/A   Occupational History  . realtor    Social History Main Topics  . Smoking status: Never Smoker   . Smokeless tobacco: Not on file  . Alcohol Use: 4.2 oz/week    7 Glasses of wine per week  . Drug Use: No  . Sexually Active: Yes -- Female partner(s)   Other Topics Concern  . Not on file   Social History Narrative  . No narrative on file   Health Maintenance  Topic Date Due  . Influenza Vaccine  01/29/2013  . Mammogram  03/20/2014  . Tetanus/tdap  04/28/2014  . Pap Smear  05/03/2015  . Colonoscopy  07/25/2021    The following portions of the patient's history were reviewed and updated as appropriate:  She  has a past medical history of Hypertension. She  does not have any pertinent problems on file. She  has no past surgical history on file. Her family history includes Alcohol abuse in her father; Alzheimer's disease (age of onset:82) in her father; Cancer in her maternal uncle and paternal aunt; Heart attack (age of onset:46) in her father; Hyperlipidemia in her father; and Hypertension in her father. She  reports that she has never smoked. She does not have any smokeless tobacco history on file. She reports that she drinks about 4.2 ounces of alcohol per week. She reports that she does not use illicit drugs. She has a current medication list which includes the following prescription(s): fexofenadine, fluticasone, fluticasone-salmeterol, lisinopril-hydrochlorothiazide, norethindrone, omeprazole, and tramadol. Current Outpatient Prescriptions on File Prior to Visit  Medication Sig Dispense Refill  . fexofenadine (ALLEGRA) 60 MG tablet Take 60 mg by mouth 2 (two) times daily.        . fluticasone (FLONASE) 50 MCG/ACT nasal  spray Place 2 sprays into the nose daily.  48 g  3  . Fluticasone-Salmeterol (ADVAIR DISKUS) 100-50 MCG/DOSE AEPB Inhale 1 puff into the lungs 2 (two) times daily.  180 each  3  . lisinopril-hydrochlorothiazide (PRINZIDE,ZESTORETIC) 10-12.5 MG per tablet 1 po qd  90 tablet  3  . norethindrone (MICRONOR) 0.35 MG tablet Take 1 tablet by mouth daily.        Marland Kitchen omeprazole (PRILOSEC) 20 MG capsule Take 1 capsule (20 mg total) by mouth daily.  90 capsule  1  . traMADol (ULTRAM) 50 MG tablet TAKE 1 TABLET (50MG ) BY MOUTH EVERY 8 HOURS AS NEEDED  90 tablet  0   She is allergic to iohexol and nitrofurantoin..  Review of Systems Review of Systems  Constitutional: Negative for activity change, appetite change and fatigue.  HENT: Negative for hearing loss, congestion, tinnitus and ear discharge.  dentist q31m Eyes: Negative for visual disturbance (see optho q1y -- vision corrected to 20/20 with glasses).  Respiratory: Negative for cough, chest tightness and shortness of breath.   Cardiovascular: Negative for chest pain, palpitations and leg swelling.  Gastrointestinal: Negative for abdominal pain, diarrhea, constipation and abdominal distention.  Genitourinary: Negative for urgency, frequency, decreased urine volume and difficulty urinating.  Musculoskeletal: Negative for back pain, arthralgias and gait problem.  Skin: Negative for color change, pallor and rash.  Neurological: Negative for dizziness, light-headedness, numbness and headaches.  Hematological: Negative for adenopathy. Does not bruise/bleed easily.  Psychiatric/Behavioral: Negative for suicidal ideas, confusion, sleep disturbance, self-injury, dysphoric mood, decreased concentration and agitation.       Objective:    BP 120/78  Pulse 74  Temp 98.6 F (37 C) (Oral)  Ht 5' 7.5" (1.715 m)  Wt 156 lb 12.8 oz (71.124 kg)  BMI 24.20 kg/m2  SpO2 96% General appearance: alert, cooperative, appears stated age and no distress Head:  Normocephalic, without obvious abnormality, atraumatic Eyes: conjunctivae/corneas clear. PERRL, EOM's intact. Fundi benign. Ears: normal TM's and external ear canals both ears Nose: Nares normal. Septum midline. Mucosa normal. No drainage or sinus tenderness. Throat: lips, mucosa, and tongue normal; teeth and gums normal Neck: no adenopathy, no carotid bruit, no JVD, supple, symmetrical, trachea midline and thyroid not enlarged, symmetric, no tenderness/mass/nodules Back: symmetric, no curvature. ROM normal. No CVA tenderness. Lungs: clear to auscultation bilaterally Breasts: gyn Heart: regular rate and rhythm, S1, S2 normal, no murmur, click, rub or gallop Abdomen: soft, non-tender; bowel sounds normal; no masses,  no organomegaly Pelvic: deferred---gyn Extremities: extremities normal, atraumatic, no cyanosis or edema Pulses: 2+ and symmetric Skin: Skin color, texture, turgor normal. No rashes or lesions Lymph nodes: Cervical, supraclavicular, and axillary nodes normal. Neurologic: Alert and oriented X 3, normal strength and tone. Normal symmetric reflexes. Normal coordination and gait psych-- no depression, no anxiety    Assessment:    Healthy female exam.      Plan:  ghm utd Check labs   See After Visit Summary for Counseling Recommendations

## 2012-07-18 ENCOUNTER — Encounter: Payer: Self-pay | Admitting: Family Medicine

## 2012-09-13 ENCOUNTER — Other Ambulatory Visit: Payer: Self-pay | Admitting: Family Medicine

## 2013-02-13 ENCOUNTER — Other Ambulatory Visit: Payer: Self-pay

## 2013-02-13 DIAGNOSIS — Z1231 Encounter for screening mammogram for malignant neoplasm of breast: Secondary | ICD-10-CM

## 2013-03-21 ENCOUNTER — Ambulatory Visit
Admission: RE | Admit: 2013-03-21 | Discharge: 2013-03-21 | Disposition: A | Discharge status: Home / Self Care | Payer: BC Managed Care – PPO | Source: Ambulatory Visit

## 2013-03-21 DIAGNOSIS — Z1231 Encounter for screening mammogram for malignant neoplasm of breast: Secondary | ICD-10-CM

## 2013-03-26 ENCOUNTER — Other Ambulatory Visit: Payer: Self-pay | Admitting: Obstetrics and Gynecology

## 2013-03-26 DIAGNOSIS — R928 Other abnormal and inconclusive findings on diagnostic imaging of breast: Secondary | ICD-10-CM

## 2013-04-04 ENCOUNTER — Ambulatory Visit (INDEPENDENT_AMBULATORY_CARE_PROVIDER_SITE_OTHER): Payer: BC Managed Care – PPO

## 2013-04-04 DIAGNOSIS — Z23 Encounter for immunization: Secondary | ICD-10-CM

## 2013-04-12 ENCOUNTER — Ambulatory Visit
Admission: RE | Admit: 2013-04-12 | Discharge: 2013-04-12 | Disposition: A | Discharge status: Home / Self Care | Payer: Self-pay | Source: Ambulatory Visit | Attending: Obstetrics and Gynecology | Admitting: Obstetrics and Gynecology

## 2013-04-12 DIAGNOSIS — R928 Other abnormal and inconclusive findings on diagnostic imaging of breast: Secondary | ICD-10-CM

## 2013-07-05 ENCOUNTER — Other Ambulatory Visit: Payer: Self-pay | Admitting: Family Medicine

## 2013-09-07 ENCOUNTER — Other Ambulatory Visit: Payer: Self-pay | Admitting: Family Medicine

## 2013-10-02 ENCOUNTER — Other Ambulatory Visit: Payer: Self-pay | Admitting: Obstetrics and Gynecology

## 2013-10-02 ENCOUNTER — Other Ambulatory Visit: Payer: Self-pay

## 2013-10-02 DIAGNOSIS — R921 Mammographic calcification found on diagnostic imaging of breast: Secondary | ICD-10-CM

## 2013-10-12 ENCOUNTER — Ambulatory Visit
Admission: RE | Admit: 2013-10-12 | Discharge: 2013-10-12 | Disposition: A | Discharge status: Home / Self Care | Payer: BC Managed Care – PPO | Source: Ambulatory Visit | Attending: Obstetrics and Gynecology | Admitting: Obstetrics and Gynecology

## 2013-10-12 DIAGNOSIS — R921 Mammographic calcification found on diagnostic imaging of breast: Secondary | ICD-10-CM

## 2013-11-06 ENCOUNTER — Encounter: Payer: BC Managed Care – PPO | Admitting: Family Medicine

## 2013-12-24 ENCOUNTER — Ambulatory Visit (INDEPENDENT_AMBULATORY_CARE_PROVIDER_SITE_OTHER): Payer: BC Managed Care – PPO | Admitting: Family Medicine

## 2013-12-24 ENCOUNTER — Encounter: Payer: Self-pay | Admitting: Family Medicine

## 2013-12-24 VITALS — BP 114/78 | HR 74 | Temp 98.3°F | Ht 67.0 in | Wt 144.0 lb

## 2013-12-24 DIAGNOSIS — Z780 Asymptomatic menopausal state: Secondary | ICD-10-CM

## 2013-12-24 DIAGNOSIS — I10 Essential (primary) hypertension: Secondary | ICD-10-CM

## 2013-12-24 DIAGNOSIS — Z0000 Encounter for general adult medical examination without abnormal findings: Secondary | ICD-10-CM

## 2013-12-24 DIAGNOSIS — Z23 Encounter for immunization: Secondary | ICD-10-CM

## 2013-12-24 DIAGNOSIS — K2100 Gastro-esophageal reflux disease with esophagitis, without bleeding: Secondary | ICD-10-CM

## 2013-12-24 DIAGNOSIS — J45909 Unspecified asthma, uncomplicated: Secondary | ICD-10-CM

## 2013-12-24 DIAGNOSIS — N951 Menopausal and female climacteric states: Secondary | ICD-10-CM

## 2013-12-24 MED ORDER — LISINOPRIL-HYDROCHLOROTHIAZIDE 10-12.5 MG PO TABS: ORAL_TABLET | ORAL | Status: DC

## 2013-12-24 MED ORDER — FLUTICASONE-SALMETEROL 100-50 MCG/DOSE IN AEPB: 1.0000 | INHALATION_SPRAY | Freq: Two times a day (BID) | RESPIRATORY_TRACT | Status: DC

## 2013-12-24 MED ORDER — OMEPRAZOLE 20 MG PO CPDR: 20.0000 mg | DELAYED_RELEASE_CAPSULE | Freq: Every day | ORAL | Status: DC

## 2013-12-24 MED ORDER — ESTRADIOL 10 MCG VA TABS: 1.0000 | ORAL_TABLET | VAGINAL | Status: DC

## 2013-12-24 NOTE — Progress Notes (Signed)
Pre visit review using our clinic review tool, if applicable. No additional management support is needed unless otherwise documented below in the visit note. 

## 2013-12-24 NOTE — Progress Notes (Signed)
Subjective:     Tina Petersen is a 53 y.o. female and is here for a comprehensive physical exam. The patient reports no problems.  History   Social History  . Marital Status: Married    Spouse Name: N/A    Number of Children: 2  . Years of Education: N/A   Occupational History  . realtor    Social History Main Topics  . Smoking status: Never Smoker   . Smokeless tobacco: Not on file  . Alcohol Use: 4.2 oz/week    7 Glasses of wine per week  . Drug Use: No  . Sexual Activity: Yes    Partners: Male   Other Topics Concern  . Not on file   Social History Narrative  . No narrative on file   Health Maintenance  Topic Date Due  . Influenza Vaccine  12/29/2013  . Tetanus/tdap  04/28/2014  . Mammogram  10/13/2015  . Pap Smear  06/13/2016  . Colonoscopy  07/25/2021    The following portions of the patient's history were reviewed and updated as appropriate:  She  has a past medical history of Hypertension. She  does not have any pertinent problems on file. She  has no past surgical history on file. Her family history includes Alcohol abuse in her father; Alzheimer's disease (age of onset: 40) in her father; Cancer in her maternal uncle and paternal aunt; Heart attack (age of onset: 61) in her father; Hyperlipidemia in her father; Hypertension in her father. She  reports that she has never smoked. She does not have any smokeless tobacco history on file. She reports that she drinks about 4.2 ounces of alcohol per week. She reports that she does not use illicit drugs. She has a current medication list which includes the following prescription(s): albuterol, estradiol, fexofenadine, fluticasone, fluticasone-salmeterol, lisinopril-hydrochlorothiazide, and omeprazole. Current Outpatient Prescriptions on File Prior to Visit  Medication Sig Dispense Refill  . albuterol (PROVENTIL HFA;VENTOLIN HFA) 108 (90 BASE) MCG/ACT inhaler Inhale 2 puffs into the lungs every 6 (six) hours as  needed for wheezing.  3 Inhaler  1  . fexofenadine (ALLEGRA) 60 MG tablet Take 60 mg by mouth 2 (two) times daily.        . fluticasone (FLONASE) 50 MCG/ACT nasal spray Place 2 sprays into the nose daily.  48 g  3   No current facility-administered medications on file prior to visit.   She is allergic to iohexol and nitrofurantoin..  Review of Systems Review of Systems  Constitutional: Negative for activity change, appetite change and fatigue.  HENT: Negative for hearing loss, congestion, tinnitus and ear discharge.  dentist q62m Eyes: Negative for visual disturbance (see optho q1y -- vision corrected to 20/20 with glasses).  Respiratory: Negative for cough, chest tightness and shortness of breath.   Cardiovascular: Negative for chest pain, palpitations and leg swelling.  Gastrointestinal: Negative for abdominal pain, diarrhea, constipation and abdominal distention.  Genitourinary: Negative for urgency, frequency, decreased urine volume and difficulty urinating.  Musculoskeletal: Negative for back pain, arthralgias and gait problem.  Skin: Negative for color change, pallor and rash.  Neurological: Negative for dizziness, light-headedness, numbness and headaches.  Hematological: Negative for adenopathy. Does not bruise/bleed easily.  Psychiatric/Behavioral: Negative for suicidal ideas, confusion, sleep disturbance, self-injury, dysphoric mood, decreased concentration and agitation.       Objective:    BP 114/78  Pulse 74  Temp(Src) 98.3 F (36.8 C) (Oral)  Ht 5\' 7"  (1.702 m)  Wt 144 lb (65.318 kg)  BMI 22.55 kg/m2  SpO2 98%  LMP 08/12/2010 General appearance: alert, cooperative, appears stated age and no distress Head: Normocephalic, without obvious abnormality, atraumatic Eyes: conjunctivae/corneas clear. PERRL, EOM's intact. Fundi benign. Ears: normal TM's and external ear canals both ears Nose: Nares normal. Septum midline. Mucosa normal. No drainage or sinus  tenderness. Throat: lips, mucosa, and tongue normal; teeth and gums normal Neck: no adenopathy, no carotid bruit, no JVD, supple, symmetrical, trachea midline and thyroid not enlarged, symmetric, no tenderness/mass/nodules Back: symmetric, no curvature. ROM normal. No CVA tenderness. Lungs: clear to auscultation bilaterally Breasts: gyn Heart: regular rate and rhythm, S1, S2 normal, no murmur, click, rub or gallop Abdomen: soft, non-tender; bowel sounds normal; no masses,  no organomegaly Pelvic: deferred--gyn Extremities: extremities normal, atraumatic, no cyanosis or edema Pulses: 2+ and symmetric Skin: Skin color, texture, turgor normal. No rashes or lesions Lymph nodes: Cervical, supraclavicular, and axillary nodes normal. Neurologic: Alert and oriented X 3, normal strength and tone. Normal symmetric reflexes. Normal coordination and gait Psych-- no depression, no anxiety      Assessment:    Healthy female exam.      Plan:  Check labs con't meds ghm utd   See After Visit Summary for Counseling Recommendations    1. Gastroesophageal reflux disease with esophagitis  - omeprazole (PRILOSEC) 20 MG capsule; Take 1 capsule (20 mg total) by mouth daily.  Dispense: 90 capsule; Refill: 3  2. Unspecified asthma(493.90)  - Fluticasone-Salmeterol (ADVAIR DISKUS) 100-50 MCG/DOSE AEPB; Inhale 1 puff into the lungs 2 (two) times daily.  Dispense: 180 each; Refill: 3  3. Essential hypertension  - lisinopril-hydrochlorothiazide (PRINZIDE,ZESTORETIC) 10-12.5 MG per tablet; TAKE 1 TABLET DAILY (OFFICE VISIT DUE NOW)  Dispense: 90 tablet; Refill: 3 - Basic metabolic panel; Future - CBC with Differential; Future - Hepatic function panel; Future - Lipid panel; Future - POCT urinalysis dipstick; Future - TSH; Future  4. Menopause  - Estradiol (VAGIFEM) 10 MCG TABS vaginal tablet; Place 1 tablet (10 mcg total) vaginally 2 (two) times a week.  Dispense: 24 tablet; Refill: 3  5.  Preventative health care  - Basic metabolic panel; Future - CBC with Differential; Future - Hepatic function panel; Future - Lipid panel; Future - POCT urinalysis dipstick; Future - TSH; Future  6. Need for diphtheria-tetanus-pertussis (Tdap) vaccine, adult/adolescent  - Tdap vaccine greater than or equal to 7yo IM

## 2013-12-24 NOTE — Patient Instructions (Signed)
Preventive Care for Adults A healthy lifestyle and preventive care can promote health and wellness. Preventive health guidelines for women include the following key practices.  A routine yearly physical is a good way to check with your health care provider about your health and preventive screening. It is a chance to share any concerns and updates on your health and to receive a thorough exam.  Visit your dentist for a routine exam and preventive care every 6 months. Brush your teeth twice a day and floss once a day. Good oral hygiene prevents tooth decay and gum disease.  The frequency of eye exams is based on your age, health, family medical history, use of contact lenses, and other factors. Follow your health care provider's recommendations for frequency of eye exams.  Eat a healthy diet. Foods like vegetables, fruits, whole grains, low-fat dairy products, and lean protein foods contain the nutrients you need without too many calories. Decrease your intake of foods high in solid fats, added sugars, and salt. Eat the right amount of calories for you.Get information about a proper diet from your health care provider, if necessary.  Regular physical exercise is one of the most important things you can do for your health. Most adults should get at least 150 minutes of moderate-intensity exercise (any activity that increases your heart rate and causes you to sweat) each week. In addition, most adults need muscle-strengthening exercises on 2 or more days a week.  Maintain a healthy weight. The body mass index (BMI) is a screening tool to identify possible weight problems. It provides an estimate of body fat based on height and weight. Your health care provider can find your BMI and can help you achieve or maintain a healthy weight.For adults 20 years and older:  A BMI below 18.5 is considered underweight.  A BMI of 18.5 to 24.9 is normal.  A BMI of 25 to 29.9 is considered overweight.  A BMI of  30 and above is considered obese.  Maintain normal blood lipids and cholesterol levels by exercising and minimizing your intake of saturated fat. Eat a balanced diet with plenty of fruit and vegetables. Blood tests for lipids and cholesterol should begin at age 76 and be repeated every 5 years. If your lipid or cholesterol levels are high, you are over 50, or you are at high risk for heart disease, you may need your cholesterol levels checked more frequently.Ongoing high lipid and cholesterol levels should be treated with medicines if diet and exercise are not working.  If you smoke, find out from your health care provider how to quit. If you do not use tobacco, do not start.  Lung cancer screening is recommended for adults aged 22-80 years who are at high risk for developing lung cancer because of a history of smoking. A yearly low-dose CT scan of the lungs is recommended for people who have at least a 30-pack-year history of smoking and are a current smoker or have quit within the past 15 years. A pack year of smoking is smoking an average of 1 pack of cigarettes a day for 1 year (for example: 1 pack a day for 30 years or 2 packs a day for 15 years). Yearly screening should continue until the smoker has stopped smoking for at least 15 years. Yearly screening should be stopped for people who develop a health problem that would prevent them from having lung cancer treatment.  If you are pregnant, do not drink alcohol. If you are breastfeeding,  be very cautious about drinking alcohol. If you are not pregnant and choose to drink alcohol, do not have more than 1 drink per day. One drink is considered to be 12 ounces (355 mL) of beer, 5 ounces (148 mL) of wine, or 1.5 ounces (44 mL) of liquor.  Avoid use of street drugs. Do not share needles with anyone. Ask for help if you need support or instructions about stopping the use of drugs.  High blood pressure causes heart disease and increases the risk of  stroke. Your blood pressure should be checked at least every 1 to 2 years. Ongoing high blood pressure should be treated with medicines if weight loss and exercise do not work.  If you are 3-86 years old, ask your health care provider if you should take aspirin to prevent strokes.  Diabetes screening involves taking a blood sample to check your fasting blood sugar level. This should be done once every 3 years, after age 67, if you are within normal weight and without risk factors for diabetes. Testing should be considered at a younger age or be carried out more frequently if you are overweight and have at least 1 risk factor for diabetes.  Breast cancer screening is essential preventive care for women. You should practice "breast self-awareness." This means understanding the normal appearance and feel of your breasts and may include breast self-examination. Any changes detected, no matter how small, should be reported to a health care provider. Women in their 8s and 30s should have a clinical breast exam (CBE) by a health care provider as part of a regular health exam every 1 to 3 years. After age 70, women should have a CBE every year. Starting at age 25, women should consider having a mammogram (breast X-ray test) every year. Women who have a family history of breast cancer should talk to their health care provider about genetic screening. Women at a high risk of breast cancer should talk to their health care providers about having an MRI and a mammogram every year.  Breast cancer gene (BRCA)-related cancer risk assessment is recommended for women who have family members with BRCA-related cancers. BRCA-related cancers include breast, ovarian, tubal, and peritoneal cancers. Having family members with these cancers may be associated with an increased risk for harmful changes (mutations) in the breast cancer genes BRCA1 and BRCA2. Results of the assessment will determine the need for genetic counseling and  BRCA1 and BRCA2 testing.  Routine pelvic exams to screen for cancer are no longer recommended for nonpregnant women who are considered low risk for cancer of the pelvic organs (ovaries, uterus, and vagina) and who do not have symptoms. Ask your health care provider if a screening pelvic exam is right for you.  If you have had past treatment for cervical cancer or a condition that could lead to cancer, you need Pap tests and screening for cancer for at least 20 years after your treatment. If Pap tests have been discontinued, your risk factors (such as having a new sexual partner) need to be reassessed to determine if screening should be resumed. Some women have medical problems that increase the chance of getting cervical cancer. In these cases, your health care provider may recommend more frequent screening and Pap tests.  The HPV test is an additional test that may be used for cervical cancer screening. The HPV test looks for the virus that can cause the cell changes on the cervix. The cells collected during the Pap test can be  tested for HPV. The HPV test could be used to screen women aged 73 years and older, and should be used in women of any age who have unclear Pap test results. After the age of 40, women should have HPV testing at the same frequency as a Pap test.  Colorectal cancer can be detected and often prevented. Most routine colorectal cancer screening begins at the age of 80 years and continues through age 59 years. However, your health care provider may recommend screening at an earlier age if you have risk factors for colon cancer. On a yearly basis, your health care provider may provide home test kits to check for hidden blood in the stool. Use of a small camera at the end of a tube, to directly examine the colon (sigmoidoscopy or colonoscopy), can detect the earliest forms of colorectal cancer. Talk to your health care provider about this at age 45, when routine screening begins. Direct  exam of the colon should be repeated every 5-10 years through age 57 years, unless early forms of pre-cancerous polyps or small growths are found.  People who are at an increased risk for hepatitis B should be screened for this virus. You are considered at high risk for hepatitis B if:  You were born in a country where hepatitis B occurs often. Talk with your health care provider about which countries are considered high risk.  Your parents were born in a high-risk country and you have not received a shot to protect against hepatitis B (hepatitis B vaccine).  You have HIV or AIDS.  You use needles to inject street drugs.  You live with, or have sex with, someone who has hepatitis B.  You get hemodialysis treatment.  You take certain medicines for conditions like cancer, organ transplantation, and autoimmune conditions.  Hepatitis C blood testing is recommended for all people born from [birth year] through 1965 and any individual with known risks for hepatitis C.  Practice safe sex. Use condoms and avoid high-risk sexual practices to reduce the spread of sexually transmitted infections (STIs). STIs include gonorrhea, chlamydia, syphilis, trichomonas, herpes, HPV, and human immunodeficiency virus (HIV). Herpes, HIV, and HPV are viral illnesses that have no cure. They can result in disability, cancer, and death.  You should be screened for sexually transmitted illnesses (STIs) including gonorrhea and chlamydia if:  You are sexually active and are younger than 24 years.  You are older than 24 years and your health care provider tells you that you are at risk for this type of infection.  Your sexual activity has changed since you were last screened and you are at an increased risk for chlamydia or gonorrhea. Ask your health care provider if you are at risk.  If you are at risk of being infected with HIV, it is recommended that you take a prescription medicine daily to prevent HIV infection. This is  called preexposure prophylaxis (PrEP). You are considered at risk if:  You are a heterosexual woman, are sexually active, and are at increased risk for HIV infection.  You take drugs by injection.  You are sexually active with a partner who has HIV.  Talk with your health care provider about whether you are at high risk of being infected with HIV. If you choose to begin PrEP, you should first be tested for HIV. You should then be tested every 3 months for as long as you are taking PrEP.  Osteoporosis is a disease in which the bones lose minerals and strength  with aging. This can result in serious bone fractures or breaks. The risk of osteoporosis can be identified using a bone density scan. Women ages 53 years and over and women at risk for fractures or osteoporosis should discuss screening with their health care providers. Ask your health care provider whether you should take a calcium supplement or vitamin D to reduce the rate of osteoporosis.  Menopause can be associated with physical symptoms and risks. Hormone replacement therapy is available to decrease symptoms and risks. You should talk to your health care provider about whether hormone replacement therapy is right for you.  Use sunscreen. Apply sunscreen liberally and repeatedly throughout the day. You should seek shade when your shadow is shorter than you. Protect yourself by wearing long sleeves, pants, a wide-brimmed hat, and sunglasses year round, whenever you are outdoors.  Once a month, do a whole body skin exam, using a mirror to look at the skin on your back. Tell your health care provider of new moles, moles that have irregular borders, moles that are larger than a pencil eraser, or moles that have changed in shape or color.  Stay current with required vaccines (immunizations).  Influenza vaccine. All adults should be immunized every year.  Tetanus, diphtheria, and acellular pertussis (Td, Tdap) vaccine. Pregnant women should  receive 1 dose of Tdap vaccine during each pregnancy. The dose should be obtained regardless of the length of time since the last dose. Immunization is preferred during the 27th-36th week of gestation. An adult who has not previously received Tdap or who does not know her vaccine status should receive 1 dose of Tdap. This initial dose should be followed by tetanus and diphtheria toxoids (Td) booster doses every 10 years. Adults with an unknown or incomplete history of completing a 3-dose immunization series with Td-containing vaccines should begin or complete a primary immunization series including a Tdap dose. Adults should receive a Td booster every 10 years.  Varicella vaccine. An adult without evidence of immunity to varicella should receive 2 doses or a second dose if she has previously received 1 dose. Pregnant females who do not have evidence of immunity should receive the first dose after pregnancy. This first dose should be obtained before leaving the health care facility. The second dose should be obtained 4-8 weeks after the first dose.  Human papillomavirus (HPV) vaccine. Females aged 13-26 years who have not received the vaccine previously should obtain the 3-dose series. The vaccine is not recommended for use in pregnant females. However, pregnancy testing is not needed before receiving a dose. If a female is found to be pregnant after receiving a dose, no treatment is needed. In that case, the remaining doses should be delayed until after the pregnancy. Immunization is recommended for any person with an immunocompromised condition through the age of 40 years if she did not get any or all doses earlier. During the 3-dose series, the second dose should be obtained 4-8 weeks after the first dose. The third dose should be obtained 24 weeks after the first dose and 16 weeks after the second dose.  Zoster vaccine. One dose is recommended for adults aged 54 years or older unless certain conditions are  present.  Measles, mumps, and rubella (MMR) vaccine. Adults born before [birth year] generally are considered immune to measles and mumps. Adults born in [birth year] or later should have 1 or more doses of MMR vaccine unless there is a contraindication to the vaccine or there is laboratory evidence of immunity to  each of the three diseases. A routine second dose of MMR vaccine should be obtained at least 28 days after the first dose for students attending postsecondary schools, health care workers, or international travelers. People who received inactivated measles vaccine or an unknown type of measles vaccine during 1963-1967 should receive 2 doses of MMR vaccine. People who received inactivated mumps vaccine or an unknown type of mumps vaccine before 1979 and are at high risk for mumps infection should consider immunization with 2 doses of MMR vaccine. For females of childbearing age, rubella immunity should be determined. If there is no evidence of immunity, females who are not pregnant should be vaccinated. If there is no evidence of immunity, females who are pregnant should delay immunization until after pregnancy. Unvaccinated health care workers born before 1957 who lack laboratory evidence of measles, mumps, or rubella immunity or laboratory confirmation of disease should consider measles and mumps immunization with 2 doses of MMR vaccine or rubella immunization with 1 dose of MMR vaccine.  Pneumococcal 13-valent conjugate (PCV13) vaccine. When indicated, a person who is uncertain of her immunization history and has no record of immunization should receive the PCV13 vaccine. An adult aged 19 years or older who has certain medical conditions and has not been previously immunized should receive 1 dose of PCV13 vaccine. This PCV13 should be followed with a dose of pneumococcal polysaccharide (PPSV23) vaccine. The PPSV23 vaccine dose should be obtained at least 8 weeks after the dose of PCV13 vaccine. An adult aged 19  years or older who has certain medical conditions and previously received 1 or more doses of PPSV23 vaccine should receive 1 dose of PCV13. The PCV13 vaccine dose should be obtained 1 or more years after the last PPSV23 vaccine dose.  Pneumococcal polysaccharide (PPSV23) vaccine. When PCV13 is also indicated, PCV13 should be obtained first. All adults aged 65 years and older should be immunized. An adult younger than age 65 years who has certain medical conditions should be immunized. Any person who resides in a nursing home or long-term care facility should be immunized. An adult smoker should be immunized. People with an immunocompromised condition and certain other conditions should receive both PCV13 and PPSV23 vaccines. People with human immunodeficiency virus (HIV) infection should be immunized as soon as possible after diagnosis. Immunization during chemotherapy or radiation therapy should be avoided. Routine use of PPSV23 vaccine is not recommended for American Indians, [place] Natives, or people younger than 65 years unless there are medical conditions that require PPSV23 vaccine. When indicated, people who have unknown immunization and have no record of immunization should receive PPSV23 vaccine. One-time revaccination 5 years after the first dose of PPSV23 is recommended for people aged 19-64 years who have chronic kidney failure, nephrotic syndrome, asplenia, or immunocompromised conditions. People who received 1-2 doses of PPSV23 before age 65 years should receive another dose of PPSV23 vaccine at age 65 years or later if at least 5 years have passed since the previous dose. Doses of PPSV23 are not needed for people immunized with PPSV23 at or after age 65 years.  Meningococcal vaccine. Adults with asplenia or persistent complement component deficiencies should receive 2 doses of quadrivalent meningococcal conjugate (MenACWY-D) vaccine. The doses should be obtained at least 2 months apart.  Microbiologists working with certain meningococcal bacteria, military recruits, people at risk during an outbreak, and people who travel to or live in countries with a high rate of meningitis should be immunized. A first-year college student up through age   21 years who is living in a residence hall should receive a dose if she did not receive a dose on or after her 16th birthday. Adults who have certain high-risk conditions should receive one or more doses of vaccine.  Hepatitis A vaccine. Adults who wish to be protected from this disease, have certain high-risk conditions, work with hepatitis A-infected animals, work in hepatitis A research labs, or travel to or work in countries with a high rate of hepatitis A should be immunized. Adults who were previously unvaccinated and who anticipate close contact with an international adoptee during the first 60 days after arrival in the [place] from a country with a high rate of hepatitis A should be immunized.  Hepatitis B vaccine. Adults who wish to be protected from this disease, have certain high-risk conditions, may be exposed to blood or other infectious body fluids, are household contacts or sex partners of hepatitis B positive people, are clients or workers in certain care facilities, or travel to or work in countries with a high rate of hepatitis B should be immunized.  Haemophilus influenzae type b (Hib) vaccine. A previously unvaccinated person with asplenia or sickle cell disease or having a scheduled splenectomy should receive 1 dose of Hib vaccine. Regardless of previous immunization, a recipient of a hematopoietic stem cell transplant should receive a 3-dose series 6-12 months after her successful transplant. Hib vaccine is not recommended for adults with HIV infection. Preventive Services / Frequency Ages 25 to 29 years  Blood pressure check.** / Every 1 to 2 years.  Lipid and cholesterol check.** / Every 5 years beginning at age  40.  Clinical breast exam.** / Every 3 years for women in their 61s and 53s.  BRCA-related cancer risk assessment.** / For women who have family members with a BRCA-related cancer (breast, ovarian, tubal, or peritoneal cancers).  Pap test.** / Every 2 years from ages 34 through 67. Every 3 years starting at age 11 through age 38 or 92 with a history of 3 consecutive normal Pap tests.  HPV screening.** / Every 3 years from ages 75 through ages 58 to 19 with a history of 3 consecutive normal Pap tests.  Hepatitis C blood test.** / For any individual with known risks for hepatitis C.  Skin self-exam. / Monthly.  Influenza vaccine. / Every year.  Tetanus, diphtheria, and acellular pertussis (Tdap, Td) vaccine.** / Consult your health care provider. Pregnant women should receive 1 dose of Tdap vaccine during each pregnancy. 1 dose of Td every 10 years.  Varicella vaccine.** / Consult your health care provider. Pregnant females who do not have evidence of immunity should receive the first dose after pregnancy.  HPV vaccine. / 3 doses over 6 months, if 36 and younger. The vaccine is not recommended for use in pregnant females. However, pregnancy testing is not needed before receiving a dose.  Measles, mumps, rubella (MMR) vaccine.** / You need at least 1 dose of MMR if you were born in 1957 or later. You may also need a 2nd dose. For females of childbearing age, rubella immunity should be determined. If there is no evidence of immunity, females who are not pregnant should be vaccinated. If there is no evidence of immunity, females who are pregnant should delay immunization until after pregnancy.  Pneumococcal 13-valent conjugate (PCV13) vaccine.** / Consult your health care provider.  Pneumococcal polysaccharide (PPSV23) vaccine.** / 1 to 2 doses if you smoke cigarettes or if you have certain conditions.  Meningococcal vaccine.** /  1 dose if you are age 19 to 21 years and a first-year college  student living in a residence hall, or have one of several medical conditions, you need to get vaccinated against meningococcal disease. You may also need additional booster doses.  Hepatitis A vaccine.** / Consult your health care provider.  Hepatitis B vaccine.** / Consult your health care provider.  Haemophilus influenzae type b (Hib) vaccine.** / Consult your health care provider. Ages 40 to 64 years  Blood pressure check.** / Every 1 to 2 years.  Lipid and cholesterol check.** / Every 5 years beginning at age 20 years.  Lung cancer screening. / Every year if you are aged 55-80 years and have a 30-pack-year history of smoking and currently smoke or have quit within the past 15 years. Yearly screening is stopped once you have quit smoking for at least 15 years or develop a health problem that would prevent you from having lung cancer treatment.  Clinical breast exam.** / Every year after age 40 years.  BRCA-related cancer risk assessment.** / For women who have family members with a BRCA-related cancer (breast, ovarian, tubal, or peritoneal cancers).  Mammogram.** / Every year beginning at age 40 years and continuing for as long as you are in good health. Consult with your health care provider.  Pap test.** / Every 3 years starting at age 30 years through age 65 or 70 years with a history of 3 consecutive normal Pap tests.  HPV screening.** / Every 3 years from ages 30 years through ages 65 to 70 years with a history of 3 consecutive normal Pap tests.  Fecal occult blood test (FOBT) of stool. / Every year beginning at age 50 years and continuing until age 75 years. You may not need to do this test if you get a colonoscopy every 10 years.  Flexible sigmoidoscopy or colonoscopy.** / Every 5 years for a flexible sigmoidoscopy or every 10 years for a colonoscopy beginning at age 50 years and continuing until age 75 years.  Hepatitis C blood test.** / For all people born from 1945 through  1965 and any individual with known risks for hepatitis C.  Skin self-exam. / Monthly.  Influenza vaccine. / Every year.  Tetanus, diphtheria, and acellular pertussis (Tdap/Td) vaccine.** / Consult your health care provider. Pregnant women should receive 1 dose of Tdap vaccine during each pregnancy. 1 dose of Td every 10 years.  Varicella vaccine.** / Consult your health care provider. Pregnant females who do not have evidence of immunity should receive the first dose after pregnancy.  Zoster vaccine.** / 1 dose for adults aged 60 years or older.  Measles, mumps, rubella (MMR) vaccine.** / You need at least 1 dose of MMR if you were born in 1957 or later. You may also need a 2nd dose. For females of childbearing age, rubella immunity should be determined. If there is no evidence of immunity, females who are not pregnant should be vaccinated. If there is no evidence of immunity, females who are pregnant should delay immunization until after pregnancy.  Pneumococcal 13-valent conjugate (PCV13) vaccine.** / Consult your health care provider.  Pneumococcal polysaccharide (PPSV23) vaccine.** / 1 to 2 doses if you smoke cigarettes or if you have certain conditions.  Meningococcal vaccine.** / Consult your health care provider.  Hepatitis A vaccine.** / Consult your health care provider.  Hepatitis B vaccine.** / Consult your health care provider.  Haemophilus influenzae type b (Hib) vaccine.** / Consult your health care provider. Ages 65   years and over  Blood pressure check.** / Every 1 to 2 years.  Lipid and cholesterol check.** / Every 5 years beginning at age 58 years.  Lung cancer screening. / Every year if you are aged 34-80 years and have a 30-pack-year history of smoking and currently smoke or have quit within the past 15 years. Yearly screening is stopped once you have quit smoking for at least 15 years or develop a health problem that would prevent you from having lung cancer  treatment.  Clinical breast exam.** / Every year after age 43 years.  BRCA-related cancer risk assessment.** / For women who have family members with a BRCA-related cancer (breast, ovarian, tubal, or peritoneal cancers).  Mammogram.** / Every year beginning at age 27 years and continuing for as long as you are in good health. Consult with your health care provider.  Pap test.** / Every 3 years starting at age 8 years through age 70 or 10 years with 3 consecutive normal Pap tests. Testing can be stopped between 65 and 70 years with 3 consecutive normal Pap tests and no abnormal Pap or HPV tests in the past 10 years.  HPV screening.** / Every 3 years from ages 47 years through ages 3 or 29 years with a history of 3 consecutive normal Pap tests. Testing can be stopped between 65 and 70 years with 3 consecutive normal Pap tests and no abnormal Pap or HPV tests in the past 10 years.  Fecal occult blood test (FOBT) of stool. / Every year beginning at age 62 years and continuing until age 23 years. You may not need to do this test if you get a colonoscopy every 10 years.  Flexible sigmoidoscopy or colonoscopy.** / Every 5 years for a flexible sigmoidoscopy or every 10 years for a colonoscopy beginning at age 51 years and continuing until age 57 years.  Hepatitis C blood test.** / For all people born from [birth year] through 1966 and any individual with known risks for hepatitis C.  Osteoporosis screening.** / A one-time screening for women ages 67 years and over and women at risk for fractures or osteoporosis.  Skin self-exam. / Monthly.  Influenza vaccine. / Every year.  Tetanus, diphtheria, and acellular pertussis (Tdap/Td) vaccine.** / 1 dose of Td every 10 years.  Varicella vaccine.** / Consult your health care provider.  Zoster vaccine.** / 1 dose for adults aged 46 years or older.  Pneumococcal 13-valent conjugate (PCV13) vaccine.** / Consult your health care provider.  Pneumococcal  polysaccharide (PPSV23) vaccine.** / 1 dose for all adults aged 30 years and older.  Meningococcal vaccine.** / Consult your health care provider.  Hepatitis A vaccine.** / Consult your health care provider.  Hepatitis B vaccine.** / Consult your health care provider.  Haemophilus influenzae type b (Hib) vaccine.** / Consult your health care provider. ** Family history and personal history of risk and conditions may change your health care provider's recommendations. Document Released: 07/13/2001 Document Revised: 10/01/2013 Document Reviewed: 10/12/2010 [facility] Patient Information 2015 [place], [place]. This information is not intended to replace advice given to you by your health care provider. Make sure you discuss any questions you have with your health care provider.

## 2014-01-01 ENCOUNTER — Other Ambulatory Visit (INDEPENDENT_AMBULATORY_CARE_PROVIDER_SITE_OTHER): Payer: BC Managed Care – PPO

## 2014-01-01 DIAGNOSIS — Z0000 Encounter for general adult medical examination without abnormal findings: Secondary | ICD-10-CM

## 2014-01-01 DIAGNOSIS — I10 Essential (primary) hypertension: Secondary | ICD-10-CM

## 2014-01-01 DIAGNOSIS — R8290 Unspecified abnormal findings in urine: Secondary | ICD-10-CM

## 2014-01-01 LAB — BASIC METABOLIC PANEL
BUN: 13 mg/dL (ref 6–23)
CALCIUM: 9.2 mg/dL (ref 8.4–10.5)
CHLORIDE: 98 meq/L (ref 96–112)
CO2: 28 meq/L (ref 19–32)
CREATININE: 0.8 mg/dL (ref 0.4–1.2)
GFR: 76.05 mL/min (ref >60.00)
GLUCOSE: 81 mg/dL (ref 70–99)
Potassium: 3.9 mEq/L (ref 3.5–5.1)
Sodium: 136 mEq/L (ref 135–145)

## 2014-01-01 LAB — POCT URINALYSIS DIPSTICK
Bilirubin, UA: NEGATIVE
KETONES UA: NEGATIVE
Nitrite, UA: NEGATIVE
PROTEIN UA: NEGATIVE
Spec Grav, UA: 1.01
Urobilinogen, UA: 0.2
pH, UA: 6.5

## 2014-01-01 LAB — LIPID PANEL
Cholesterol: 202 mg/dL — ABNORMAL HIGH (ref 0–200)
HDL: 76 mg/dL (ref >39.00)
LDL CALC: 111 mg/dL — AB (ref 0–99)
NonHDL: 126
Total CHOL/HDL Ratio: 3
Triglycerides: 76 mg/dL (ref 0.0–149.0)
VLDL: 15.2 mg/dL (ref 0.0–40.0)

## 2014-01-01 LAB — CBC WITH DIFFERENTIAL/PLATELET
BASOS ABS: 0 10*3/uL (ref 0.0–0.1)
Basophils Relative: 0.4 % (ref 0.0–3.0)
Eosinophils Absolute: 0.3 10*3/uL (ref 0.0–0.7)
Eosinophils Relative: 5.9 % — ABNORMAL HIGH (ref 0.0–5.0)
HCT: 42.7 % (ref 36.0–46.0)
Hemoglobin: 14.4 g/dL (ref 12.0–15.0)
LYMPHS PCT: 46.4 % — AB (ref 12.0–46.0)
Lymphs Abs: 2.5 10*3/uL (ref 0.7–4.0)
MCHC: 33.6 g/dL (ref 30.0–36.0)
MCV: 96.8 fl (ref 78.0–100.0)
Monocytes Absolute: 0.4 10*3/uL (ref 0.1–1.0)
Monocytes Relative: 8 % (ref 3.0–12.0)
Neutro Abs: 2.1 10*3/uL (ref 1.4–7.7)
Neutrophils Relative %: 39.3 % — ABNORMAL LOW (ref 43.0–77.0)
PLATELETS: 317 10*3/uL (ref 150.0–400.0)
RBC: 4.41 Mil/uL (ref 3.87–5.11)
RDW: 13 % (ref 11.5–15.5)
WBC: 5.5 10*3/uL (ref 4.0–10.5)

## 2014-01-01 LAB — HEPATIC FUNCTION PANEL
ALK PHOS: 47 U/L (ref 39–117)
ALT: 15 U/L (ref 0–35)
AST: 21 U/L (ref 0–37)
Albumin: 4.2 g/dL (ref 3.5–5.2)
BILIRUBIN DIRECT: 0 mg/dL (ref 0.0–0.3)
BILIRUBIN TOTAL: 0.7 mg/dL (ref 0.2–1.2)
TOTAL PROTEIN: 7.2 g/dL (ref 6.0–8.3)

## 2014-01-01 LAB — TSH: TSH: 2.68 u[IU]/mL (ref 0.35–4.50)

## 2014-01-03 ENCOUNTER — Other Ambulatory Visit: Payer: Self-pay

## 2014-01-03 LAB — URINE CULTURE: Colony Count: 60000

## 2014-01-03 MED ORDER — FLUCONAZOLE 150 MG PO TABS: 150.0000 mg | ORAL_TABLET | Freq: Once | ORAL | Status: DC

## 2014-01-21 ENCOUNTER — Telehealth: Payer: Self-pay | Admitting: *Deleted

## 2014-01-21 NOTE — Telephone Encounter (Signed)
Received Patient Highlights and Medication Graphic from Dr. Etter Sjogren.  All forms faxed to [facility] at [phone]).  Confirmation received.//AB/CMA

## 2014-02-15 ENCOUNTER — Other Ambulatory Visit: Payer: Self-pay | Admitting: Family Medicine

## 2014-02-15 ENCOUNTER — Other Ambulatory Visit: Payer: Self-pay | Admitting: Obstetrics and Gynecology

## 2014-02-15 DIAGNOSIS — R921 Mammographic calcification found on diagnostic imaging of breast: Secondary | ICD-10-CM

## 2014-03-28 ENCOUNTER — Ambulatory Visit
Admission: RE | Admit: 2014-03-28 | Discharge: 2014-03-28 | Disposition: A | Discharge status: Home / Self Care | Payer: BC Managed Care – PPO | Source: Ambulatory Visit | Attending: Family Medicine | Admitting: Family Medicine

## 2014-03-28 DIAGNOSIS — R921 Mammographic calcification found on diagnostic imaging of breast: Secondary | ICD-10-CM

## 2015-01-29 ENCOUNTER — Other Ambulatory Visit: Payer: Self-pay | Admitting: Family Medicine

## 2015-01-29 NOTE — Telephone Encounter (Signed)
Lisinopril / HCTZ sent to pharmacy, #90. Pt last seen 11/2013 and needs office visit for follow up. Please call pt to arrange appt.  Thanks!

## 2015-03-04 ENCOUNTER — Other Ambulatory Visit: Payer: Self-pay | Admitting: Family Medicine

## 2015-03-04 DIAGNOSIS — R921 Mammographic calcification found on diagnostic imaging of breast: Secondary | ICD-10-CM

## 2015-03-11 ENCOUNTER — Ambulatory Visit: Payer: Self-pay

## 2015-04-02 ENCOUNTER — Other Ambulatory Visit: Payer: Self-pay | Admitting: Family Medicine

## 2015-04-03 ENCOUNTER — Ambulatory Visit
Admission: RE | Admit: 2015-04-03 | Discharge: 2015-04-03 | Disposition: A | Discharge status: Home / Self Care | Payer: BLUE CROSS/BLUE SHIELD | Source: Ambulatory Visit | Attending: Family Medicine | Admitting: Family Medicine

## 2015-04-03 ENCOUNTER — Other Ambulatory Visit: Payer: Self-pay | Admitting: Family Medicine

## 2015-04-03 DIAGNOSIS — R921 Mammographic calcification found on diagnostic imaging of breast: Secondary | ICD-10-CM

## 2015-04-03 DIAGNOSIS — N6310 Unspecified lump in the right breast, unspecified quadrant: Secondary | ICD-10-CM

## 2015-06-10 ENCOUNTER — Encounter: Payer: Self-pay | Admitting: Family Medicine

## 2015-06-10 ENCOUNTER — Ambulatory Visit (INDEPENDENT_AMBULATORY_CARE_PROVIDER_SITE_OTHER): Payer: BLUE CROSS/BLUE SHIELD | Admitting: Family Medicine

## 2015-06-10 VITALS — BP 101/64 | HR 74 | Temp 97.9°F | Ht 67.0 in | Wt 153.4 lb

## 2015-06-10 DIAGNOSIS — Z0000 Encounter for general adult medical examination without abnormal findings: Secondary | ICD-10-CM | POA: Diagnosis not present

## 2015-06-10 DIAGNOSIS — I10 Essential (primary) hypertension: Secondary | ICD-10-CM

## 2015-06-10 DIAGNOSIS — Z23 Encounter for immunization: Secondary | ICD-10-CM | POA: Diagnosis not present

## 2015-06-10 DIAGNOSIS — Z1159 Encounter for screening for other viral diseases: Secondary | ICD-10-CM

## 2015-06-10 DIAGNOSIS — Z114 Encounter for screening for human immunodeficiency virus [HIV]: Secondary | ICD-10-CM

## 2015-06-10 NOTE — Patient Instructions (Addendum)
Preventive Care for Adults, Female A healthy lifestyle and preventive care can promote health and wellness. Preventive health guidelines for women include the following key practices.  A routine yearly physical is a good way to check with your health care provider about your health and preventive screening. It is a chance to share any concerns and updates on your health and to receive a thorough exam.  Visit your dentist for a routine exam and preventive care every 6 months. Brush your teeth twice a day and floss once a day. Good oral hygiene prevents tooth decay and gum disease.  The frequency of eye exams is based on your age, health, family medical history, use of contact lenses, and other factors. Follow your health care provider's recommendations for frequency of eye exams.  Eat a healthy diet. Foods like vegetables, fruits, whole grains, low-fat dairy products, and lean protein foods contain the nutrients you need without too many calories. Decrease your intake of foods high in solid fats, added sugars, and salt. Eat the right amount of calories for you.Get information about a proper diet from your health care provider, if necessary.  Regular physical exercise is one of the most important things you can do for your health. Most adults should get at least 150 minutes of moderate-intensity exercise (any activity that increases your heart rate and causes you to sweat) each week. In addition, most adults need muscle-strengthening exercises on 2 or more days a week.  Maintain a healthy weight. The body mass index (BMI) is a screening tool to identify possible weight problems. It provides an estimate of body fat based on height and weight. Your health care provider can find your BMI and can help you achieve or maintain a healthy weight.For adults 20 years and older:  A BMI below 18.5 is considered underweight.  A BMI of 18.5 to 24.9 is normal.  A BMI of 25 to 29.9 is considered overweight.  A  BMI of 30 and above is considered obese.  Maintain normal blood lipids and cholesterol levels by exercising and minimizing your intake of saturated fat. Eat a balanced diet with plenty of fruit and vegetables. Blood tests for lipids and cholesterol should begin at age 45 and be repeated every 5 years. If your lipid or cholesterol levels are high, you are over 50, or you are at high risk for heart disease, you may need your cholesterol levels checked more frequently.Ongoing high lipid and cholesterol levels should be treated with medicines if diet and exercise are not working.  If you smoke, find out from your health care provider how to quit. If you do not use tobacco, do not start.  Lung cancer screening is recommended for adults aged 45-80 years who are at high risk for developing lung cancer because of a history of smoking. A yearly low-dose CT scan of the lungs is recommended for people who have at least a 30-pack-year history of smoking and are a current smoker or have quit within the past 15 years. A pack year of smoking is smoking an average of 1 pack of cigarettes a day for 1 year (for example: 1 pack a day for 30 years or 2 packs a day for 15 years). Yearly screening should continue until the smoker has stopped smoking for at least 15 years. Yearly screening should be stopped for people who develop a health problem that would prevent them from having lung cancer treatment.  If you are pregnant, do not drink alcohol. If you are  breastfeeding, be very cautious about drinking alcohol. If you are not pregnant and choose to drink alcohol, do not have more than 1 drink per day. One drink is considered to be 12 ounces (355 mL) of beer, 5 ounces (148 mL) of wine, or 1.5 ounces (44 mL) of liquor.  Avoid use of street drugs. Do not share needles with anyone. Ask for help if you need support or instructions about stopping the use of drugs.  High blood pressure causes heart disease and increases the risk  of stroke. Your blood pressure should be checked at least every 1 to 2 years. Ongoing high blood pressure should be treated with medicines if weight loss and exercise do not work.  If you are 55-79 years old, ask your health care provider if you should take aspirin to prevent strokes.  Diabetes screening is done by taking a blood sample to check your blood glucose level after you have not eaten for a certain period of time (fasting). If you are not overweight and you do not have risk factors for diabetes, you should be screened once every 3 years starting at age 45. If you are overweight or obese and you are 40-70 years of age, you should be screened for diabetes every year as part of your cardiovascular risk assessment.  Breast cancer screening is essential preventive care for women. You should practice "breast self-awareness." This means understanding the normal appearance and feel of your breasts and may include breast self-examination. Any changes detected, no matter how small, should be reported to a health care provider. Women in their 20s and 30s should have a clinical breast exam (CBE) by a health care provider as part of a regular health exam every 1 to 3 years. After age 40, women should have a CBE every year. Starting at age 40, women should consider having a mammogram (breast X-ray test) every year. Women who have a family history of breast cancer should talk to their health care provider about genetic screening. Women at a high risk of breast cancer should talk to their health care providers about having an MRI and a mammogram every year.  Breast cancer gene (BRCA)-related cancer risk assessment is recommended for women who have family members with BRCA-related cancers. BRCA-related cancers include breast, ovarian, tubal, and peritoneal cancers. Having family members with these cancers may be associated with an increased risk for harmful changes (mutations) in the breast cancer genes BRCA1 and  BRCA2. Results of the assessment will determine the need for genetic counseling and BRCA1 and BRCA2 testing.  Your health care provider may recommend that you be screened regularly for cancer of the pelvic organs (ovaries, uterus, and vagina). This screening involves a pelvic examination, including checking for microscopic changes to the surface of your cervix (Pap test). You may be encouraged to have this screening done every 3 years, beginning at age 21.  For women ages 30-65, health care providers may recommend pelvic exams and Pap testing every 3 years, or they may recommend the Pap and pelvic exam, combined with testing for human papilloma virus (HPV), every 5 years. Some types of HPV increase your risk of cervical cancer. Testing for HPV may also be done on women of any age with unclear Pap test results.  Other health care providers may not recommend any screening for nonpregnant women who are considered low risk for pelvic cancer and who do not have symptoms. Ask your health care provider if a screening pelvic exam is right for   you.  If you have had past treatment for cervical cancer or a condition that could lead to cancer, you need Pap tests and screening for cancer for at least 20 years after your treatment. If Pap tests have been discontinued, your risk factors (such as having a new sexual partner) need to be reassessed to determine if screening should resume. Some women have medical problems that increase the chance of getting cervical cancer. In these cases, your health care provider may recommend more frequent screening and Pap tests.  Colorectal cancer can be detected and often prevented. Most routine colorectal cancer screening begins at the age of 50 years and continues through age 75 years. However, your health care provider may recommend screening at an earlier age if you have risk factors for colon cancer. On a yearly basis, your health care provider may provide home test kits to check  for hidden blood in the stool. Use of a small camera at the end of a tube, to directly examine the colon (sigmoidoscopy or colonoscopy), can detect the earliest forms of colorectal cancer. Talk to your health care provider about this at age 50, when routine screening begins. Direct exam of the colon should be repeated every 5-10 years through age 75 years, unless early forms of precancerous polyps or small growths are found.  People who are at an increased risk for hepatitis B should be screened for this virus. You are considered at high risk for hepatitis B if:  You were born in a country where hepatitis B occurs often. Talk with your health care provider about which countries are considered high risk.  Your parents were born in a high-risk country and you have not received a shot to protect against hepatitis B (hepatitis B vaccine).  You have HIV or AIDS.  You use needles to inject street drugs.  You live with, or have sex with, someone who has hepatitis B.  You get hemodialysis treatment.  You take certain medicines for conditions like cancer, organ transplantation, and autoimmune conditions.  Hepatitis C blood testing is recommended for all people born from 1945 through 1965 and any individual with known risks for hepatitis C.  Practice safe sex. Use condoms and avoid high-risk sexual practices to reduce the spread of sexually transmitted infections (STIs). STIs include gonorrhea, chlamydia, syphilis, trichomonas, herpes, HPV, and human immunodeficiency virus (HIV). Herpes, HIV, and HPV are viral illnesses that have no cure. They can result in disability, cancer, and death.  You should be screened for sexually transmitted illnesses (STIs) including gonorrhea and chlamydia if:  You are sexually active and are younger than 24 years.  You are older than 24 years and your health care provider tells you that you are at risk for this type of infection.  Your sexual activity has changed  since you were last screened and you are at an increased risk for chlamydia or gonorrhea. Ask your health care provider if you are at risk.  If you are at risk of being infected with HIV, it is recommended that you take a prescription medicine daily to prevent HIV infection. This is called preexposure prophylaxis (PrEP). You are considered at risk if:  You are sexually active and do not regularly use condoms or know the HIV status of your partner(s).  You take drugs by injection.  You are sexually active with a partner who has HIV.  Talk with your health care provider about whether you are at high risk of being infected with HIV. If   you choose to begin PrEP, you should first be tested for HIV. You should then be tested every 3 months for as long as you are taking PrEP.  Osteoporosis is a disease in which the bones lose minerals and strength with aging. This can result in serious bone fractures or breaks. The risk of osteoporosis can be identified using a bone density scan. Women ages 67 years and over and women at risk for fractures or osteoporosis should discuss screening with their health care providers. Ask your health care provider whether you should take a calcium supplement or vitamin D to reduce the rate of osteoporosis.  Menopause can be associated with physical symptoms and risks. Hormone replacement therapy is available to decrease symptoms and risks. You should talk to your health care provider about whether hormone replacement therapy is right for you.  Use sunscreen. Apply sunscreen liberally and repeatedly throughout the day. You should seek shade when your shadow is shorter than you. Protect yourself by wearing long sleeves, pants, a wide-brimmed hat, and sunglasses year round, whenever you are outdoors.  Once a month, do a whole body skin exam, using a mirror to look at the skin on your back. Tell your health care provider of new moles, moles that have irregular borders, moles that  are larger than a pencil eraser, or moles that have changed in shape or color.  Stay current with required vaccines (immunizations).  Influenza vaccine. All adults should be immunized every year.  Tetanus, diphtheria, and acellular pertussis (Td, Tdap) vaccine. Pregnant women should receive 1 dose of Tdap vaccine during each pregnancy. The dose should be obtained regardless of the length of time since the last dose. Immunization is preferred during the 27th-36th week of gestation. An adult who has not previously received Tdap or who does not know her vaccine status should receive 1 dose of Tdap. This initial dose should be followed by tetanus and diphtheria toxoids (Td) booster doses every 10 years. Adults with an unknown or incomplete history of completing a 3-dose immunization series with Td-containing vaccines should begin or complete a primary immunization series including a Tdap dose. Adults should receive a Td booster every 10 years.  Varicella vaccine. An adult without evidence of immunity to varicella should receive 2 doses or a second dose if she has previously received 1 dose. Pregnant females who do not have evidence of immunity should receive the first dose after pregnancy. This first dose should be obtained before leaving the health care facility. The second dose should be obtained 4-8 weeks after the first dose.  Human papillomavirus (HPV) vaccine. Females aged 13-26 years who have not received the vaccine previously should obtain the 3-dose series. The vaccine is not recommended for use in pregnant females. However, pregnancy testing is not needed before receiving a dose. If a female is found to be pregnant after receiving a dose, no treatment is needed. In that case, the remaining doses should be delayed until after the pregnancy. Immunization is recommended for any person with an immunocompromised condition through the age of 61 years if she did not get any or all doses earlier. During the  3-dose series, the second dose should be obtained 4-8 weeks after the first dose. The third dose should be obtained 24 weeks after the first dose and 16 weeks after the second dose.  Zoster vaccine. One dose is recommended for adults aged 30 years or older unless certain conditions are present.  Measles, mumps, and rubella (MMR) vaccine. Adults born  before 1957 generally are considered immune to measles and mumps. Adults born in 1957 or later should have 1 or more doses of MMR vaccine unless there is a contraindication to the vaccine or there is laboratory evidence of immunity to each of the three diseases. A routine second dose of MMR vaccine should be obtained at least 28 days after the first dose for students attending postsecondary schools, health care workers, or international travelers. People who received inactivated measles vaccine or an unknown type of measles vaccine during 1963-1967 should receive 2 doses of MMR vaccine. People who received inactivated mumps vaccine or an unknown type of mumps vaccine before 1979 and are at high risk for mumps infection should consider immunization with 2 doses of MMR vaccine. For females of childbearing age, rubella immunity should be determined. If there is no evidence of immunity, females who are not pregnant should be vaccinated. If there is no evidence of immunity, females who are pregnant should delay immunization until after pregnancy. Unvaccinated health care workers born before 1957 who lack laboratory evidence of measles, mumps, or rubella immunity or laboratory confirmation of disease should consider measles and mumps immunization with 2 doses of MMR vaccine or rubella immunization with 1 dose of MMR vaccine.  Pneumococcal 13-valent conjugate (PCV13) vaccine. When indicated, a person who is uncertain of his immunization history and has no record of immunization should receive the PCV13 vaccine. All adults 65 years of age and older should receive this  vaccine. An adult aged 19 years or older who has certain medical conditions and has not been previously immunized should receive 1 dose of PCV13 vaccine. This PCV13 should be followed with a dose of pneumococcal polysaccharide (PPSV23) vaccine. Adults who are at high risk for pneumococcal disease should obtain the PPSV23 vaccine at least 8 weeks after the dose of PCV13 vaccine. Adults older than 56 years of age who have normal immune system function should obtain the PPSV23 vaccine dose at least 1 year after the dose of PCV13 vaccine.  Pneumococcal polysaccharide (PPSV23) vaccine. When PCV13 is also indicated, PCV13 should be obtained first. All adults aged 65 years and older should be immunized. An adult younger than age 65 years who has certain medical conditions should be immunized. Any person who resides in a nursing home or long-term care facility should be immunized. An adult smoker should be immunized. People with an immunocompromised condition and certain other conditions should receive both PCV13 and PPSV23 vaccines. People with human immunodeficiency virus (HIV) infection should be immunized as soon as possible after diagnosis. Immunization during chemotherapy or radiation therapy should be avoided. Routine use of PPSV23 vaccine is not recommended for American Indians, [place] Natives, or people younger than 65 years unless there are medical conditions that require PPSV23 vaccine. When indicated, people who have unknown immunization and have no record of immunization should receive PPSV23 vaccine. One-time revaccination 5 years after the first dose of PPSV23 is recommended for people aged 19-64 years who have chronic kidney failure, nephrotic syndrome, asplenia, or immunocompromised conditions. People who received 1-2 doses of PPSV23 before age 65 years should receive another dose of PPSV23 vaccine at age 65 years or later if at least 5 years have passed since the previous dose. Doses of PPSV23 are not  needed for people immunized with PPSV23 at or after age 65 years.  Meningococcal vaccine. Adults with asplenia or persistent complement component deficiencies should receive 2 doses of quadrivalent meningococcal conjugate (MenACWY-D) vaccine. The doses should be obtained   at least 2 months apart. Microbiologists working with certain meningococcal bacteria, [place] recruits, people at risk during an outbreak, and people who travel to or live in countries with a high rate of meningitis should be immunized. A first-year college student up through age 34 years who is living in a residence hall should receive a dose if she did not receive a dose on or after her 16th birthday. Adults who have certain high-risk conditions should receive one or more doses of vaccine.  Hepatitis A vaccine. Adults who wish to be protected from this disease, have certain high-risk conditions, work with hepatitis A-infected animals, work in hepatitis A research labs, or travel to or work in countries with a high rate of hepatitis A should be immunized. Adults who were previously unvaccinated and who anticipate close contact with an international adoptee during the first 60 days after arrival in the [place] from a country with a high rate of hepatitis A should be immunized.  Hepatitis B vaccine. Adults who wish to be protected from this disease, have certain high-risk conditions, may be exposed to blood or other infectious body fluids, are household contacts or sex partners of hepatitis B positive people, are clients or workers in certain care facilities, or travel to or work in countries with a high rate of hepatitis B should be immunized.  Haemophilus influenzae type b (Hib) vaccine. A previously unvaccinated person with asplenia or sickle cell disease or having a scheduled splenectomy should receive 1 dose of Hib vaccine. Regardless of previous immunization, a recipient of a hematopoietic stem cell transplant should receive a  3-dose series 6-12 months after her successful transplant. Hib vaccine is not recommended for adults with HIV infection. Preventive Services / Frequency Ages 35 to 4 years  Blood pressure check.** / Every 3-5 years.  Lipid and cholesterol check.** / Every 5 years beginning at age 60.  Clinical breast exam.** / Every 3 years for women in their 71s and 10s.  BRCA-related cancer risk assessment.** / For women who have family members with a BRCA-related cancer (breast, ovarian, tubal, or peritoneal cancers).  Pap test.** / Every 2 years from ages 76 through 26. Every 3 years starting at age 61 through age 76 or 93 with a history of 3 consecutive normal Pap tests.  HPV screening.** / Every 3 years from ages 37 through ages 60 to 51 with a history of 3 consecutive normal Pap tests.  Hepatitis C blood test.** / For any individual with known risks for hepatitis C.  Skin self-exam. / Monthly.  Influenza vaccine. / Every year.  Tetanus, diphtheria, and acellular pertussis (Tdap, Td) vaccine.** / Consult your health care provider. Pregnant women should receive 1 dose of Tdap vaccine during each pregnancy. 1 dose of Td every 10 years.  Varicella vaccine.** / Consult your health care provider. Pregnant females who do not have evidence of immunity should receive the first dose after pregnancy.  HPV vaccine. / 3 doses over 6 months, if 93 and younger. The vaccine is not recommended for use in pregnant females. However, pregnancy testing is not needed before receiving a dose.  Measles, mumps, rubella (MMR) vaccine.** / You need at least 1 dose of MMR if you were born in 1957 or later. You may also need a 2nd dose. For females of childbearing age, rubella immunity should be determined. If there is no evidence of immunity, females who are not pregnant should be vaccinated. If there is no evidence of immunity, females who are  pregnant should delay immunization until after pregnancy.  Pneumococcal  13-valent conjugate (PCV13) vaccine.** / Consult your health care provider.  Pneumococcal polysaccharide (PPSV23) vaccine.** / 1 to 2 doses if you smoke cigarettes or if you have certain conditions.  Meningococcal vaccine.** / 1 dose if you are age 68 to 8 years and a Market researcher living in a residence hall, or have one of several medical conditions, you need to get vaccinated against meningococcal disease. You may also need additional booster doses.  Hepatitis A vaccine.** / Consult your health care provider.  Hepatitis B vaccine.** / Consult your health care provider.  Haemophilus influenzae type b (Hib) vaccine.** / Consult your health care provider. Ages 7 to 53 years  Blood pressure check.** / Every year.  Lipid and cholesterol check.** / Every 5 years beginning at age 25 years.  Lung cancer screening. / Every year if you are aged 11-80 years and have a 30-pack-year history of smoking and currently smoke or have quit within the past 15 years. Yearly screening is stopped once you have quit smoking for at least 15 years or develop a health problem that would prevent you from having lung cancer treatment.  Clinical breast exam.** / Every year after age 48 years.  BRCA-related cancer risk assessment.** / For women who have family members with a BRCA-related cancer (breast, ovarian, tubal, or peritoneal cancers).  Mammogram.** / Every year beginning at age 41 years and continuing for as long as you are in good health. Consult with your health care provider.  Pap test.** / Every 3 years starting at age 65 years through age 37 or 70 years with a history of 3 consecutive normal Pap tests.  HPV screening.** / Every 3 years from ages 72 years through ages 60 to 40 years with a history of 3 consecutive normal Pap tests.  Fecal occult blood test (FOBT) of stool. / Every year beginning at age 21 years and continuing until age 5 years. You may not need to do this test if you get  a colonoscopy every 10 years.  Flexible sigmoidoscopy or colonoscopy.** / Every 5 years for a flexible sigmoidoscopy or every 10 years for a colonoscopy beginning at age 35 years and continuing until age 48 years.  Hepatitis C blood test.** / For all people born from [birth year] through 1965 and any individual with known risks for hepatitis C.  Skin self-exam. / Monthly.  Influenza vaccine. / Every year.  Tetanus, diphtheria, and acellular pertussis (Tdap/Td) vaccine.** / Consult your health care provider. Pregnant women should receive 1 dose of Tdap vaccine during each pregnancy. 1 dose of Td every 10 years.  Varicella vaccine.** / Consult your health care provider. Pregnant females who do not have evidence of immunity should receive the first dose after pregnancy.  Zoster vaccine.** / 1 dose for adults aged 30 years or older.  Measles, mumps, rubella (MMR) vaccine.** / You need at least 1 dose of MMR if you were born in 1957 or later. You may also need a second dose. For females of childbearing age, rubella immunity should be determined. If there is no evidence of immunity, females who are not pregnant should be vaccinated. If there is no evidence of immunity, females who are pregnant should delay immunization until after pregnancy.  Pneumococcal 13-valent conjugate (PCV13) vaccine.** / Consult your health care provider.  Pneumococcal polysaccharide (PPSV23) vaccine.** / 1 to 2 doses if you smoke cigarettes or if you have certain conditions.  Meningococcal vaccine.** /  Consult your health care provider.  Hepatitis A vaccine.** / Consult your health care provider.  Hepatitis B vaccine.** / Consult your health care provider.  Haemophilus influenzae type b (Hib) vaccine.** / Consult your health care provider. Ages 65 years and over  Blood pressure check.** / Every year.  Lipid and cholesterol check.** / Every 5 years beginning at age 20 years.  Lung cancer screening. / Every year if you  are aged 55-80 years and have a 30-pack-year history of smoking and currently smoke or have quit within the past 15 years. Yearly screening is stopped once you have quit smoking for at least 15 years or develop a health problem that would prevent you from having lung cancer treatment.  Clinical breast exam.** / Every year after age 40 years.  BRCA-related cancer risk assessment.** / For women who have family members with a BRCA-related cancer (breast, ovarian, tubal, or peritoneal cancers).  Mammogram.** / Every year beginning at age 40 years and continuing for as long as you are in good health. Consult with your health care provider.  Pap test.** / Every 3 years starting at age 30 years through age 65 or 70 years with 3 consecutive normal Pap tests. Testing can be stopped between 65 and 70 years with 3 consecutive normal Pap tests and no abnormal Pap or HPV tests in the past 10 years.  HPV screening.** / Every 3 years from ages 30 years through ages 65 or 70 years with a history of 3 consecutive normal Pap tests. Testing can be stopped between 65 and 70 years with 3 consecutive normal Pap tests and no abnormal Pap or HPV tests in the past 10 years.  Fecal occult blood test (FOBT) of stool. / Every year beginning at age 50 years and continuing until age 75 years. You may not need to do this test if you get a colonoscopy every 10 years.  Flexible sigmoidoscopy or colonoscopy.** / Every 5 years for a flexible sigmoidoscopy or every 10 years for a colonoscopy beginning at age 50 years and continuing until age 75 years.  Hepatitis C blood test.** / For all people born from 1945 through 1965 and any individual with known risks for hepatitis C.  Osteoporosis screening.** / A one-time screening for women ages 65 years and over and women at risk for fractures or osteoporosis.  Skin self-exam. / Monthly.  Influenza vaccine. / Every year.  Tetanus, diphtheria, and acellular pertussis (Tdap/Td)  vaccine.** / 1 dose of Td every 10 years.  Varicella vaccine.** / Consult your health care provider.  Zoster vaccine.** / 1 dose for adults aged 60 years or older.  Pneumococcal 13-valent conjugate (PCV13) vaccine.** / Consult your health care provider.  Pneumococcal polysaccharide (PPSV23) vaccine.** / 1 dose for all adults aged 65 years and older.  Meningococcal vaccine.** / Consult your health care provider.  Hepatitis A vaccine.** / Consult your health care provider.  Hepatitis B vaccine.** / Consult your health care provider.  Haemophilus influenzae type b (Hib) vaccine.** / Consult your health care provider. ** Family history and personal history of risk and conditions may change your health care provider's recommendations.   This information is not intended to replace advice given to you by your health care provider. Make sure you discuss any questions you have with your health care provider.   Document Released: 07/13/2001 Document Revised: 06/07/2014 Document Reviewed: 10/12/2010 Elsevier Interactive Patient Education 2016 Elsevier Inc.  

## 2015-06-10 NOTE — Progress Notes (Signed)
Pre visit review using our clinic review tool, if applicable. No additional management support is needed unless otherwise documented below in the visit note. 

## 2015-06-10 NOTE — Progress Notes (Signed)
Subjective:     Tina Petersen is a 55 y.o. female and is here for a comprehensive physical exam. The patient reports no problems.  Social History   Social History  . Marital Status: Married    Spouse Name: N/A  . Number of Children: 2  . Years of Education: N/A   Occupational History  . realtor    Social History Main Topics  . Smoking status: Former Smoker    Quit date: 06/09/1989  . Smokeless tobacco: Former Neurosurgeon  . Alcohol Use: 6.0 oz/week    10 Glasses of wine per week  . Drug Use: No  . Sexual Activity:    Partners: Male   Other Topics Concern  . Not on file   Social History Narrative   Health Maintenance  Topic Date Due  . Hepatitis C Screening  November 06, 1960  . HIV Screening  09/01/1974  . INFLUENZA VACCINE  12/30/2015  . PAP SMEAR  06/13/2016  . MAMMOGRAM  04/02/2017  . COLONOSCOPY  07/25/2021  . TETANUS/TDAP  12/25/2023    The following portions of the patient's history were reviewed and updated as appropriate:  She  has a past medical history of Hypertension. She  does not have any pertinent problems on file. She  has no past surgical history on file. Her family history includes Alcohol abuse in her father; Alzheimer's disease (age of onset: 90) in her father; Cancer in her maternal uncle and paternal aunt; Heart attack (age of onset: 33) in her father; Hyperlipidemia in her father; Hypertension in her father. She  reports that she quit smoking about 26 years ago. She has quit using smokeless tobacco. She reports that she drinks about 6.0 oz of alcohol per week. She reports that she does not use illicit drugs. She has a current medication list which includes the following prescription(s): advair diskus, albuterol, aspirin, b complex vitamins, vitamin d3, estradiol, fexofenadine, fluticasone, glucosamine-chondroit-vit c-mn, lisinopril-hydrochlorothiazide, multivitamin with minerals, omeprazole, and fluconazole. Current Outpatient Prescriptions on File Prior to  Visit  Medication Sig Dispense Refill  . ADVAIR DISKUS 100-50 MCG/DOSE AEPB USE 1 INHALATION TWICE A DAY 180 each 2  . albuterol (PROVENTIL HFA;VENTOLIN HFA) 108 (90 BASE) MCG/ACT inhaler Inhale 2 puffs into the lungs every 6 (six) hours as needed for wheezing. 3 Inhaler 1  . Estradiol (VAGIFEM) 10 MCG TABS vaginal tablet Place 1 tablet (10 mcg total) vaginally 2 (two) times a week. 24 tablet 3  . fexofenadine (ALLEGRA) 60 MG tablet Take 60 mg by mouth 2 (two) times daily as needed.     . fluticasone (FLONASE) 50 MCG/ACT nasal spray Place 2 sprays into the nose daily. 48 g 3  . lisinopril-hydrochlorothiazide (PRINZIDE,ZESTORETIC) 10-12.5 MG tablet TAKE 1 TABLET DAILY (NEED OFFICE VISIT NOW) 90 tablet 0  . omeprazole (PRILOSEC) 20 MG capsule TAKE 1 CAPSULE DAILY 90 capsule 0  . fluconazole (DIFLUCAN) 150 MG tablet Take 1 tablet (150 mg total) by mouth once. (Patient not taking: Reported on 06/10/2015) 1 tablet 0   No current facility-administered medications on file prior to visit.   She is allergic to iohexol and nitrofurantoin..  Review of Systems Review of Systems  Constitutional: Negative for activity change, appetite change and fatigue.  HENT: Negative for hearing loss, congestion, tinnitus and ear discharge.  dentist q28m Eyes: Negative for visual disturbance (see optho q1y -- vision corrected to 20/20 with glasses).  Respiratory: Negative for cough, chest tightness and shortness of breath.   Cardiovascular: Negative for chest pain,  palpitations and leg swelling.  Gastrointestinal: Negative for abdominal pain, diarrhea, constipation and abdominal distention.  Genitourinary: Negative for urgency, frequency, decreased urine volume and difficulty urinating.  Musculoskeletal: Negative for back pain, arthralgias and gait problem.  Skin: Negative for color change, pallor and rash.  Neurological: Negative for dizziness, light-headedness, numbness and headaches.  Hematological: Negative for  adenopathy. Does not bruise/bleed easily.  Psychiatric/Behavioral: Negative for suicidal ideas, confusion, sleep disturbance, self-injury, dysphoric mood, decreased concentration and agitation.       Objective:    BP 101/64 mmHg  Pulse 74  Temp(Src) 97.9 F (36.6 C) (Oral)  Ht '5\' 7"'$  (1.702 m)  Wt 153 lb 6.4 oz (69.582 kg)  BMI 24.02 kg/m2  SpO2 97%  LMP 08/12/2010 General appearance: alert, cooperative, appears stated age and no distress Head: Normocephalic, without obvious abnormality, atraumatic Eyes: conjunctivae/corneas clear. PERRL, EOM's intact. Fundi benign. Ears: normal TM's and external ear canals both ears Nose: Nares normal. Septum midline. Mucosa normal. No drainage or sinus tenderness. Throat: lips, mucosa, and tongue normal; teeth and gums normal Neck: no adenopathy, no carotid bruit, no JVD, supple, symmetrical, trachea midline and thyroid not enlarged, symmetric, no tenderness/mass/nodules Back: symmetric, no curvature. ROM normal. No CVA tenderness. Lungs: clear to auscultation bilaterally Breasts: normal appearance, no masses or tenderness Heart: S1, S2 normal Abdomen: soft, non-tender; bowel sounds normal; no masses,  no organomegaly Pelvic: deferred Extremities: extremities normal, atraumatic, no cyanosis or edema Pulses: 2+ and symmetric Skin: Skin color, texture, turgor normal. No rashes or lesions Lymph nodes: Cervical, supraclavicular, and axillary nodes normal. Neurologic: Alert and oriented X 3, normal strength and tone. Normal symmetric reflexes. Normal coordination and gait Psych- no depression, no anxiety      Assessment:    Healthy female exam.      Plan:    ghm utd  Check labs See After Visit Summary for Counseling Recommendations    1. Need for immunization against influenza   - Flu Vaccine QUAD 36+ mos IM (Fluarix) - Comp Met (CMET); Future - CBC with Differential/Platelet; Future - Lipid panel; Future - POCT urinalysis dipstick;  Future - TSH; Future  2. Essential hypertension Stable.  Current outpatient prescriptions:  .  ADVAIR DISKUS 100-50 MCG/DOSE AEPB, USE 1 INHALATION TWICE A DAY, Disp: 180 each, Rfl: 2 .  albuterol (PROVENTIL HFA;VENTOLIN HFA) 108 (90 BASE) MCG/ACT inhaler, Inhale 2 puffs into the lungs every 6 (six) hours as needed for wheezing., Disp: 3 Inhaler, Rfl: 1 .  aspirin 81 MG tablet, Take 81 mg by mouth daily., Disp: , Rfl:  .  b complex vitamins tablet, Take 1 tablet by mouth daily., Disp: , Rfl:  .  Cholecalciferol (VITAMIN D3) 2000 units TABS, Take 1 tablet by mouth daily., Disp: , Rfl:  .  Estradiol (VAGIFEM) 10 MCG TABS vaginal tablet, Place 1 tablet (10 mcg total) vaginally 2 (two) times a week., Disp: 24 tablet, Rfl: 3 .  fexofenadine (ALLEGRA) 60 MG tablet, Take 60 mg by mouth 2 (two) times daily as needed. , Disp: , Rfl:  .  fluticasone (FLONASE) 50 MCG/ACT nasal spray, Place 2 sprays into the nose daily., Disp: 48 g, Rfl: 3 .  Glucosamine-Chondroit-Vit C-Mn (GLUCOSAMINE 1500 COMPLEX PO), Take 1 tablet by mouth daily., Disp: , Rfl:  .  lisinopril-hydrochlorothiazide (PRINZIDE,ZESTORETIC) 10-12.5 MG tablet, TAKE 1 TABLET DAILY (NEED OFFICE VISIT NOW), Disp: 90 tablet, Rfl: 0 .  Multiple Vitamins-Minerals (MULTIVITAMIN WITH MINERALS) tablet, Take 1 tablet by mouth daily., Disp: , Rfl:  .  omeprazole (PRILOSEC) 20 MG capsule, TAKE 1 CAPSULE DAILY, Disp: 90 capsule, Rfl: 0 .  fluconazole (DIFLUCAN) 150 MG tablet, Take 1 tablet (150 mg total) by mouth once. (Patient not taking: Reported on 06/10/2015), Disp: 1 tablet, Rfl: 0  - Comp Met (CMET); Future - CBC with Differential/Platelet; Future - Lipid panel; Future - POCT urinalysis dipstick; Future - TSH; Future  3. Preventative health care   - Comp Met (CMET); Future - CBC with Differential/Platelet; Future - Lipid panel; Future - POCT urinalysis dipstick; Future - TSH; Future  4. Need for hepatitis C screening test   - Hepatitis  C antibody  5. Encounter for screening for HIV   - HIV antibody

## 2015-06-11 ENCOUNTER — Other Ambulatory Visit (INDEPENDENT_AMBULATORY_CARE_PROVIDER_SITE_OTHER): Payer: BLUE CROSS/BLUE SHIELD

## 2015-06-11 ENCOUNTER — Encounter: Payer: Self-pay | Admitting: *Deleted

## 2015-06-11 DIAGNOSIS — Z23 Encounter for immunization: Secondary | ICD-10-CM | POA: Diagnosis not present

## 2015-06-11 DIAGNOSIS — R8290 Unspecified abnormal findings in urine: Secondary | ICD-10-CM

## 2015-06-11 DIAGNOSIS — Z0000 Encounter for general adult medical examination without abnormal findings: Secondary | ICD-10-CM

## 2015-06-11 DIAGNOSIS — I10 Essential (primary) hypertension: Secondary | ICD-10-CM

## 2015-06-11 LAB — CBC WITH DIFFERENTIAL/PLATELET
BASOS PCT: 0.5 % (ref 0.0–3.0)
Basophils Absolute: 0 10*3/uL (ref 0.0–0.1)
EOS PCT: 6.3 % — AB (ref 0.0–5.0)
Eosinophils Absolute: 0.4 10*3/uL (ref 0.0–0.7)
HCT: 43.6 % (ref 36.0–46.0)
HEMOGLOBIN: 14.6 g/dL (ref 12.0–15.0)
LYMPHS ABS: 2.4 10*3/uL (ref 0.7–4.0)
Lymphocytes Relative: 38.3 % (ref 12.0–46.0)
MCHC: 33.4 g/dL (ref 30.0–36.0)
MCV: 95.5 fl (ref 78.0–100.0)
MONO ABS: 0.5 10*3/uL (ref 0.1–1.0)
Monocytes Relative: 7.8 % (ref 3.0–12.0)
NEUTROS PCT: 47.1 % (ref 43.0–77.0)
Neutro Abs: 2.9 10*3/uL (ref 1.4–7.7)
Platelets: 323 10*3/uL (ref 150.0–400.0)
RBC: 4.56 Mil/uL (ref 3.87–5.11)
RDW: 12.5 % (ref 11.5–15.5)
WBC: 6.2 10*3/uL (ref 4.0–10.5)

## 2015-06-11 LAB — POCT URINALYSIS DIPSTICK
BILIRUBIN UA: NEGATIVE
GLUCOSE UA: NEGATIVE
Ketones, UA: NEGATIVE
Nitrite, UA: NEGATIVE
PH UA: 6
Protein, UA: NEGATIVE
RBC UA: NEGATIVE
SPEC GRAV UA: 1.025
UROBILINOGEN UA: NEGATIVE

## 2015-06-11 LAB — LIPID PANEL
CHOLESTEROL: 232 mg/dL — AB (ref 0–200)
HDL: 81.1 mg/dL (ref >39.00)
LDL CALC: 135 mg/dL — AB (ref 0–99)
NonHDL: 150.87
Total CHOL/HDL Ratio: 3
Triglycerides: 78 mg/dL (ref 0.0–149.0)
VLDL: 15.6 mg/dL (ref 0.0–40.0)

## 2015-06-11 LAB — COMPREHENSIVE METABOLIC PANEL
ALT: 13 U/L (ref 0–35)
AST: 18 U/L (ref 0–37)
Albumin: 4.5 g/dL (ref 3.5–5.2)
Alkaline Phosphatase: 49 U/L (ref 39–117)
BUN: 10 mg/dL (ref 6–23)
CHLORIDE: 97 meq/L (ref 96–112)
CO2: 32 meq/L (ref 19–32)
Calcium: 9.7 mg/dL (ref 8.4–10.5)
Creatinine, Ser: 0.8 mg/dL (ref 0.40–1.20)
GFR: 78.93 mL/min (ref >60.00)
GLUCOSE: 79 mg/dL (ref 70–99)
POTASSIUM: 3.9 meq/L (ref 3.5–5.1)
SODIUM: 135 meq/L (ref 135–145)
Total Bilirubin: 0.8 mg/dL (ref 0.2–1.2)
Total Protein: 7.3 g/dL (ref 6.0–8.3)

## 2015-06-11 LAB — TSH: TSH: 4.12 u[IU]/mL (ref 0.35–4.50)

## 2015-06-11 NOTE — Addendum Note (Signed)
Addended by: Peggyann Shoals on: 06/11/2015 11:18 AM   Modules accepted: Orders

## 2015-06-13 LAB — URINE CULTURE

## 2015-06-18 ENCOUNTER — Other Ambulatory Visit: Payer: Self-pay

## 2015-06-18 MED ORDER — CIPROFLOXACIN HCL 250 MG PO TABS: 250.0000 mg | ORAL_TABLET | Freq: Two times a day (BID) | ORAL | Status: DC

## 2015-07-09 ENCOUNTER — Other Ambulatory Visit: Payer: Self-pay | Admitting: Family Medicine

## 2015-08-05 ENCOUNTER — Other Ambulatory Visit: Payer: Self-pay

## 2015-08-05 MED ORDER — ALBUTEROL SULFATE HFA 108 (90 BASE) MCG/ACT IN AERS: 2.0000 | INHALATION_SPRAY | Freq: Four times a day (QID) | RESPIRATORY_TRACT | Status: DC | PRN

## 2015-08-15 ENCOUNTER — Other Ambulatory Visit: Payer: Self-pay

## 2015-08-15 MED ORDER — ALBUTEROL SULFATE HFA 108 (90 BASE) MCG/ACT IN AERS: 2.0000 | INHALATION_SPRAY | Freq: Four times a day (QID) | RESPIRATORY_TRACT | Status: DC | PRN

## 2015-08-16 ENCOUNTER — Other Ambulatory Visit: Payer: Self-pay | Admitting: Family Medicine

## 2015-11-18 ENCOUNTER — Ambulatory Visit (INDEPENDENT_AMBULATORY_CARE_PROVIDER_SITE_OTHER): Payer: BLUE CROSS/BLUE SHIELD | Admitting: Internal Medicine

## 2015-11-18 ENCOUNTER — Encounter: Payer: Self-pay | Admitting: Internal Medicine

## 2015-11-18 VITALS — BP 122/76 | HR 71 | Temp 98.4°F | Ht 67.0 in | Wt 149.1 lb

## 2015-11-18 DIAGNOSIS — J0110 Acute frontal sinusitis, unspecified: Secondary | ICD-10-CM

## 2015-11-18 MED ORDER — AZELASTINE HCL 0.1 % NA SOLN: 2.0000 | Freq: Every evening | NASAL | Status: AC | PRN

## 2015-11-18 MED ORDER — AZITHROMYCIN 250 MG PO TABS: ORAL_TABLET | ORAL | Status: DC

## 2015-11-18 MED ORDER — FLUCONAZOLE 150 MG PO TABS: 150.0000 mg | ORAL_TABLET | Freq: Every day | ORAL | Status: DC

## 2015-11-18 NOTE — Progress Notes (Signed)
Pre visit review using our clinic review tool, if applicable. No additional management support is needed unless otherwise documented below in the visit note. 

## 2015-11-18 NOTE — Progress Notes (Signed)
Subjective:    Patient ID: Tina Petersen, female    DOB: 05-08-1961, 55 y.o.   MRN: [id number]  DOS:  11/18/2015 Type of visit - description : acute Interval history: Sx started Several days ago, felt like she had a stye in the left eye, that got a little better, then a moderate B sinus congestion started, ++ PND, green in color, pools in the throat, make her cough   Review of Systems  History of asthma, currently asymptomatic. Good compliance of medication. No fever chills No nausea vomiting No actual chest congestion. Past Medical History  Diagnosis Date  . Hypertension     History reviewed. No pertinent past surgical history.  Social History   Social History  . Marital Status: Married    Spouse Name: N/A  . Number of Children: 2  . Years of Education: N/A   Occupational History  . realtor    Social History Main Topics  . Smoking status: Former Smoker    Quit date: 06/09/1989  . Smokeless tobacco: Former Systems developer  . Alcohol Use: 6.0 oz/week    10 Glasses of wine per week  . Drug Use: No  . Sexual Activity:    Partners: Male   Other Topics Concern  . Not on file   Social History Narrative        Medication List       This list is accurate as of: 11/18/15 11:59 PM.  Always use your most recent med list.               ADVAIR DISKUS 100-50 MCG/DOSE Aepb  Generic drug:  Fluticasone-Salmeterol  USE 1 INHALATION TWICE A DAY     albuterol 108 (90 Base) MCG/ACT inhaler  Commonly known as:  PROVENTIL HFA;VENTOLIN HFA  Inhale 2 puffs into the lungs every 6 (six) hours as needed for wheezing.     aspirin 81 MG tablet  Take 81 mg by mouth daily.     azelastine 0.1 % nasal spray  Commonly known as:  ASTELIN  Place 2 sprays into both nostrils at bedtime as needed for rhinitis. Use in each nostril as directed     azithromycin 250 MG tablet  Commonly known as:  ZITHROMAX Z-PAK  2 tabs a day the first day, then 1 tab a day x 4 days     b complex vitamins  tablet  Take 1 tablet by mouth daily.     Estradiol 10 MCG Tabs vaginal tablet  Commonly known as:  VAGIFEM  Place 1 tablet (10 mcg total) vaginally 2 (two) times a week.     fexofenadine 60 MG tablet  Commonly known as:  ALLEGRA  Take 60 mg by mouth 2 (two) times daily as needed.     fluconazole 150 MG tablet  Commonly known as:  DIFLUCAN  Take 1 tablet (150 mg total) by mouth daily.     fluticasone 50 MCG/ACT nasal spray  Commonly known as:  FLONASE  Place 2 sprays into the nose daily.     GLUCOSAMINE 1500 COMPLEX PO  Take 1 tablet by mouth daily.     lisinopril-hydrochlorothiazide 10-12.5 MG tablet  Commonly known as:  PRINZIDE,ZESTORETIC  Take 1 tablet by mouth daily.     multivitamin with minerals tablet  Take 1 tablet by mouth daily.     omeprazole 20 MG capsule  Commonly known as:  PRILOSEC  TAKE 1 CAPSULE DAILY     Vitamin D3 2000 units Tabs  Take  1 tablet by mouth daily.           Objective:   Physical Exam BP 122/76 mmHg  Pulse 71  Temp(Src) 98.4 F (36.9 C) (Oral)  Ht 5\' 7"  (1.702 m)  Wt 149 lb 2 oz (67.643 kg)  BMI 23.35 kg/m2  SpO2 96%  LMP 08/12/2010 General:   Well developed, well nourished . NAD.  HEENT:  Normocephalic . Face symmetric, atraumatic. L conjunctiva is slightly red, no stye-like changes noted. Nose: Congested, sinuses no TTP, TMs normal. Throat symmetric, minimal redness, pulses normal in size Lungs:  CTA B Normal respiratory effort, no intercostal retractions, no accessory muscle use. Heart: RRR,  no murmur.  No pretibial edema bilaterally  Skin: Not pale. Not jaundice Neurologic:  alert & oriented X3.  Speech normal, gait appropriate for age and unassisted Psych--  Cognition and judgment appear intact.  Cooperative with normal attention span and concentration.  Behavior appropriate. No anxious or depressed appearing.      Assessment & Plan:   Patient with a history of HTN, seasonal asthma, GERD presents with  symptoms consistent with sinusitis, exam is benign. Plan: C instructions, has a tendency to have yeast infection, to take Diflucan if needed.

## 2015-11-18 NOTE — Patient Instructions (Addendum)
Rest, fluids , tylenol  For cough:  Take Mucinex DM twice a day as needed until better  For nasal congestion: Use OTC Nasocort or Flonase : 2 nasal sprays on each side of the nose in the morning until you feel better Use ASTELIN a prescribed spray : 2 nasal sprays on each side of the nose at night until you feel better   Avoid decongestants such as  Pseudoephedrine or phenylephrine    Take the antibiotic as prescribed  (zithromax)  Call if not gradually better over the next  10 days  Call anytime if the symptoms are severe   If you develop a yeast infex, take diflucan

## 2016-02-08 ENCOUNTER — Other Ambulatory Visit: Payer: Self-pay | Admitting: Family Medicine

## 2016-02-09 NOTE — Telephone Encounter (Signed)
Please schedule patient for follow up appt. Pt was last seen by PCP 06/10/15, PCP requested 3 month follow up. Thank you----PC

## 2016-02-17 NOTE — Telephone Encounter (Signed)
Left message on voicemail informing patient to call the office and schedule follow up

## 2016-03-02 ENCOUNTER — Other Ambulatory Visit: Payer: Self-pay | Admitting: Obstetrics and Gynecology

## 2016-03-02 DIAGNOSIS — Z1231 Encounter for screening mammogram for malignant neoplasm of breast: Secondary | ICD-10-CM

## 2016-03-02 NOTE — Telephone Encounter (Signed)
Pt has been scheduled.  °

## 2016-04-05 ENCOUNTER — Ambulatory Visit
Admission: RE | Admit: 2016-04-05 | Discharge: 2016-04-05 | Disposition: A | Discharge status: Home / Self Care | Payer: BLUE CROSS/BLUE SHIELD | Source: Ambulatory Visit | Attending: Obstetrics and Gynecology | Admitting: Obstetrics and Gynecology

## 2016-04-05 DIAGNOSIS — Z1231 Encounter for screening mammogram for malignant neoplasm of breast: Secondary | ICD-10-CM

## 2016-04-12 ENCOUNTER — Ambulatory Visit (INDEPENDENT_AMBULATORY_CARE_PROVIDER_SITE_OTHER): Payer: BLUE CROSS/BLUE SHIELD | Admitting: Family Medicine

## 2016-04-12 ENCOUNTER — Encounter: Payer: Self-pay | Admitting: Family Medicine

## 2016-04-12 VITALS — BP 120/82 | HR 61 | Temp 98.1°F | Resp 16 | Ht 67.0 in | Wt 152.4 lb

## 2016-04-12 DIAGNOSIS — Z780 Asymptomatic menopausal state: Secondary | ICD-10-CM

## 2016-04-12 DIAGNOSIS — I10 Essential (primary) hypertension: Secondary | ICD-10-CM

## 2016-04-12 DIAGNOSIS — Z23 Encounter for immunization: Secondary | ICD-10-CM

## 2016-04-12 LAB — COMPREHENSIVE METABOLIC PANEL
ALBUMIN: 4.4 g/dL (ref 3.5–5.2)
ALK PHOS: 55 U/L (ref 39–117)
ALT: 18 U/L (ref 0–35)
AST: 23 U/L (ref 0–37)
BUN: 6 mg/dL (ref 6–23)
CALCIUM: 9.5 mg/dL (ref 8.4–10.5)
CO2: 29 mEq/L (ref 19–32)
Chloride: 97 mEq/L (ref 96–112)
Creatinine, Ser: 0.74 mg/dL (ref 0.40–1.20)
GFR: 86.1 mL/min (ref >60.00)
GLUCOSE: 103 mg/dL — AB (ref 70–99)
POTASSIUM: 3.9 meq/L (ref 3.5–5.1)
Sodium: 133 mEq/L — ABNORMAL LOW (ref 135–145)
TOTAL PROTEIN: 7.5 g/dL (ref 6.0–8.3)
Total Bilirubin: 0.9 mg/dL (ref 0.2–1.2)

## 2016-04-12 LAB — CBC WITH DIFFERENTIAL/PLATELET
BASOS ABS: 0 10*3/uL (ref 0.0–0.1)
Basophils Relative: 0.4 % (ref 0.0–3.0)
EOS PCT: 4.5 % (ref 0.0–5.0)
Eosinophils Absolute: 0.3 10*3/uL (ref 0.0–0.7)
HEMATOCRIT: 43.4 % (ref 36.0–46.0)
HEMOGLOBIN: 14.7 g/dL (ref 12.0–15.0)
LYMPHS PCT: 32.9 % (ref 12.0–46.0)
Lymphs Abs: 2.1 10*3/uL (ref 0.7–4.0)
MCHC: 33.9 g/dL (ref 30.0–36.0)
MCV: 95.8 fl (ref 78.0–100.0)
MONOS PCT: 8.4 % (ref 3.0–12.0)
Monocytes Absolute: 0.5 10*3/uL (ref 0.1–1.0)
Neutro Abs: 3.4 10*3/uL (ref 1.4–7.7)
Neutrophils Relative %: 53.8 % (ref 43.0–77.0)
Platelets: 324 10*3/uL (ref 150.0–400.0)
RBC: 4.53 Mil/uL (ref 3.87–5.11)
RDW: 13.8 % (ref 11.5–15.5)
WBC: 6.3 10*3/uL (ref 4.0–10.5)

## 2016-04-12 LAB — LIPID PANEL
Cholesterol: 220 mg/dL — ABNORMAL HIGH (ref 0–200)
HDL: 82.4 mg/dL (ref >39.00)
LDL Cholesterol: 116 mg/dL — ABNORMAL HIGH (ref 0–99)
NONHDL: 137.65
TRIGLYCERIDES: 108 mg/dL (ref 0.0–149.0)
Total CHOL/HDL Ratio: 3
VLDL: 21.6 mg/dL (ref 0.0–40.0)

## 2016-04-12 LAB — POCT URINALYSIS DIPSTICK
BILIRUBIN UA: NEGATIVE
Blood, UA: NEGATIVE
Glucose, UA: NEGATIVE
KETONES UA: NEGATIVE
LEUKOCYTES UA: NEGATIVE
NITRITE UA: NEGATIVE
Protein, UA: NEGATIVE
Spec Grav, UA: 1.01
Urobilinogen, UA: 0.2
pH, UA: 6

## 2016-04-12 MED ORDER — LISINOPRIL-HYDROCHLOROTHIAZIDE 10-12.5 MG PO TABS: 1.0000 | ORAL_TABLET | Freq: Every day | ORAL | 1 refills | Status: DC

## 2016-04-12 MED ORDER — ESTRADIOL 10 MCG VA TABS: ORAL_TABLET | VAGINAL | 3 refills | Status: DC

## 2016-04-12 NOTE — Progress Notes (Signed)
Pre visit review using our clinic review tool, if applicable. No additional management support is needed unless otherwise documented below in the visit note. 

## 2016-04-12 NOTE — Assessment & Plan Note (Signed)
Stable Cont' lisinopril hct

## 2016-04-12 NOTE — Patient Instructions (Signed)
Hypertension Hypertension, commonly called high blood pressure, is when the force of blood pumping through your arteries is too strong. Your arteries are the blood vessels that carry blood from your heart throughout your body. A blood pressure reading consists of a higher number over a lower number, such as 110/72. The higher number (systolic) is the pressure inside your arteries when your heart pumps. The lower number (diastolic) is the pressure inside your arteries when your heart relaxes. Ideally you want your blood pressure below 120/80. Hypertension forces your heart to work harder to pump blood. Your arteries may become narrow or stiff. Having untreated or uncontrolled hypertension can cause heart attack, stroke, kidney disease, and other problems. RISK FACTORS Some risk factors for high blood pressure are controllable. Others are not.  Risk factors you cannot control include:   Race. You may be at higher risk if you are African American.  Age. Risk increases with age.  Gender. Men are at higher risk than women before age 45 years. After age 65, women are at higher risk than men. Risk factors you can control include:  Not getting enough exercise or physical activity.  Being overweight.  Getting too much fat, sugar, calories, or salt in your diet.  Drinking too much alcohol. SIGNS AND SYMPTOMS Hypertension does not usually cause signs or symptoms. Extremely high blood pressure (hypertensive crisis) may cause headache, anxiety, shortness of breath, and nosebleed. DIAGNOSIS To check if you have hypertension, your health care provider will measure your blood pressure while you are seated, with your arm held at the level of your heart. It should be measured at least twice using the same arm. Certain conditions can cause a difference in blood pressure between your right and left arms. A blood pressure reading that is higher than normal on one occasion does not mean that you need treatment. If  it is not clear whether you have high blood pressure, you may be asked to return on a different day to have your blood pressure checked again. Or, you may be asked to monitor your blood pressure at home for 1 or more weeks. TREATMENT Treating high blood pressure includes making lifestyle changes and possibly taking medicine. Living a healthy lifestyle can help lower high blood pressure. You may need to change some of your habits. Lifestyle changes may include:  Following the DASH diet. This diet is high in fruits, vegetables, and whole grains. It is low in salt, red meat, and added sugars.  Keep your sodium intake below 2,300 mg per day.  Getting at least 30-45 minutes of aerobic exercise at least 4 times per week.  Losing weight if necessary.  Not smoking.  Limiting alcoholic beverages.  Learning ways to reduce stress. Your health care provider may prescribe medicine if lifestyle changes are not enough to get your blood pressure under control, and if one of the following is true:  You are 18-59 years of age and your systolic blood pressure is above 140.  You are 60 years of age or older, and your systolic blood pressure is above 150.  Your diastolic blood pressure is above 90.  You have diabetes, and your systolic blood pressure is over 140 or your diastolic blood pressure is over 90.  You have kidney disease and your blood pressure is above 140/90.  You have heart disease and your blood pressure is above 140/90. Your personal target blood pressure may vary depending on your medical conditions, your age, and other factors. HOME CARE INSTRUCTIONS    Have your blood pressure rechecked as directed by your health care provider.   Take medicines only as directed by your health care provider. Follow the directions carefully. Blood pressure medicines must be taken as prescribed. The medicine does not work as well when you skip doses. Skipping doses also puts you at risk for  problems.  Do not smoke.   Monitor your blood pressure at home as directed by your health care provider. SEEK MEDICAL CARE IF:   You think you are having a reaction to medicines taken.  You have recurrent headaches or feel dizzy.  You have swelling in your ankles.  You have trouble with your vision. SEEK IMMEDIATE MEDICAL CARE IF:  You develop a severe headache or confusion.  You have unusual weakness, numbness, or feel faint.  You have severe chest or abdominal pain.  You vomit repeatedly.  You have trouble breathing. MAKE SURE YOU:   Understand these instructions.  Will watch your condition.  Will get help right away if you are not doing well or get worse.   This information is not intended to replace advice given to you by your health care provider. Make sure you discuss any questions you have with your health care provider.   Document Released: 05/17/2005 Document Revised: 10/01/2014 Document Reviewed: 03/09/2013 Elsevier Interactive Patient Education 2016 [employer]  

## 2016-04-12 NOTE — Progress Notes (Signed)
Patient ID: Tina Petersen, female    DOB: 02/13/1961  Age: 55 y.o. MRN: [id number]    Subjective:  Subjective  HPI Maleiyah E Molesky presents for f/u bp and refills.    Review of Systems  Constitutional: Negative for appetite change, diaphoresis, fatigue and unexpected weight change.  Eyes: Negative for pain, redness and visual disturbance.  Respiratory: Negative for cough, chest tightness, shortness of breath and wheezing.   Cardiovascular: Negative for chest pain, palpitations and leg swelling.  Endocrine: Negative for cold intolerance, heat intolerance, polydipsia, polyphagia and polyuria.  Genitourinary: Negative for difficulty urinating, dysuria and frequency.  Neurological: Negative for dizziness, light-headedness, numbness and headaches.    History Past Medical History:  Diagnosis Date  . Hypertension     She has no past surgical history on file.   Her family history includes Alcohol abuse in her father; Alzheimer's disease (age of onset: 34) in her father; Cancer in her maternal uncle and paternal aunt; Heart attack (age of onset: 73) in her father; Hyperlipidemia in her father; Hypertension in her father.She reports that she quit smoking about 26 years ago. She has quit using smokeless tobacco. She reports that she drinks about 6.0 oz of alcohol per week . She reports that she does not use drugs.  Current Outpatient Prescriptions on File Prior to Visit  Medication Sig Dispense Refill  . ADVAIR DISKUS 100-50 MCG/DOSE AEPB USE 1 INHALATION TWICE A DAY 180 each 2  . albuterol (PROVENTIL HFA;VENTOLIN HFA) 108 (90 Base) MCG/ACT inhaler Inhale 2 puffs into the lungs every 6 (six) hours as needed for wheezing. 3 Inhaler 1  . aspirin 81 MG tablet Take 81 mg by mouth daily.    Marland Kitchen azelastine (ASTELIN) 0.1 % nasal spray Place 2 sprays into both nostrils at bedtime as needed for rhinitis. Use in each nostril as directed 30 mL 3  . b complex vitamins tablet Take 1 tablet by mouth daily.      . Cholecalciferol (VITAMIN D3) 2000 units TABS Take 1 tablet by mouth daily.    . fexofenadine (ALLEGRA) 60 MG tablet Take 60 mg by mouth 2 (two) times daily as needed.     . fluticasone (FLONASE) 50 MCG/ACT nasal spray Place 2 sprays into the nose daily. 48 g 3  . Glucosamine-Chondroit-Vit C-Mn (GLUCOSAMINE 1500 COMPLEX PO) Take 1 tablet by mouth daily.    . Multiple Vitamins-Minerals (MULTIVITAMIN WITH MINERALS) tablet Take 1 tablet by mouth daily.    Marland Kitchen omeprazole (PRILOSEC) 20 MG capsule TAKE 1 CAPSULE DAILY 90 capsule 3   No current facility-administered medications on file prior to visit.      Objective:  Objective  Physical Exam  Constitutional: She is oriented to person, place, and time. She appears well-developed and well-nourished.  HENT:  Head: Normocephalic and atraumatic.  Eyes: Conjunctivae and EOM are normal.  Neck: Normal range of motion. Neck supple. No JVD present. Carotid bruit is not present. No thyromegaly present.  Cardiovascular: Normal rate, regular rhythm and normal heart sounds.   No murmur heard. Pulmonary/Chest: Effort normal and breath sounds normal. No respiratory distress. She has no wheezes. She has no rales. She exhibits no tenderness.  Musculoskeletal: She exhibits no edema.  Neurological: She is alert and oriented to person, place, and time.  Psychiatric: She has a normal mood and affect. Her behavior is normal. Judgment and thought content normal.  Nursing note and vitals reviewed.  BP 120/82 (BP Location: Right Arm, Patient Position: Sitting, Cuff Size: Normal)  Pulse 61   Temp 98.1 F (36.7 C) (Oral)   Resp 16   Ht 5\' 7"  (1.702 m)   Wt 152 lb 6.4 oz (69.1 kg)   LMP 08/12/2010   SpO2 96%   BMI 23.87 kg/m  Wt Readings from Last 3 Encounters:  04/12/16 152 lb 6.4 oz (69.1 kg)  11/18/15 149 lb 2 oz (67.6 kg)  06/10/15 153 lb 6.4 oz (69.6 kg)     Lab Results  Component Value Date   WBC 6.2 06/11/2015   HGB 14.6 06/11/2015   HCT 43.6  06/11/2015   PLT 323.0 06/11/2015   GLUCOSE 79 06/11/2015   CHOL 232 (H) 06/11/2015   TRIG 78.0 06/11/2015   HDL 81.10 06/11/2015   LDLDIRECT 125.6 08/19/2010   LDLCALC 135 (H) 06/11/2015   ALT 13 06/11/2015   AST 18 06/11/2015   NA 135 06/11/2015   K 3.9 06/11/2015   CL 97 06/11/2015   CREATININE 0.80 06/11/2015   BUN 10 06/11/2015   CO2 32 06/11/2015   TSH 4.12 06/11/2015    Mm Screening Breast Tomo Bilateral  Result Date: 04/07/2016 CLINICAL DATA:  Screening. EXAM: 2D DIGITAL SCREENING BILATERAL MAMMOGRAM WITH CAD AND ADJUNCT TOMO COMPARISON:  Previous exam(s). ACR Breast Density Category b: There are scattered areas of fibroglandular density. FINDINGS: There are no findings suspicious for malignancy. Images were processed with CAD. IMPRESSION: No mammographic evidence of malignancy. A result letter of this screening mammogram will be mailed directly to the patient. RECOMMENDATION: Screening mammogram in one year. (Code:SM-B-01Y) BI-RADS CATEGORY  1: Negative. Electronically Signed   By: Altamese Cabal M.D.   On: 04/07/2016 09:50     Assessment & Plan:  Plan  I have discontinued Ms. Sciandra azithromycin and fluconazole. I have also changed her Estradiol and lisinopril-hydrochlorothiazide. Additionally, I am having her maintain her fexofenadine, fluticasone, ADVAIR DISKUS, aspirin, Glucosamine-Chondroit-Vit C-Mn (GLUCOSAMINE 1500 COMPLEX PO), multivitamin with minerals, b complex vitamins, Vitamin D3, omeprazole, albuterol, and azelastine.  Meds ordered this encounter  Medications  . Estradiol (VAGIFEM) 10 MCG TABS vaginal tablet    Sig: 1 pv weekly    Dispense:  12 tablet    Refill:  3  . lisinopril-hydrochlorothiazide (PRINZIDE,ZESTORETIC) 10-12.5 MG tablet    Sig: Take 1 tablet by mouth daily.    Dispense:  90 tablet    Refill:  1    Problem List Items Addressed This Visit      Medium   Essential hypertension - Primary    Stable Cont' lisinopril hct        Relevant Medications   lisinopril-hydrochlorothiazide (PRINZIDE,ZESTORETIC) 10-12.5 MG tablet   Other Relevant Orders   Lipid panel   Comprehensive metabolic panel   CBC with Differential/Platelet   POCT urinalysis dipstick (Completed)    Other Visit Diagnoses    Encounter for immunization       Relevant Medications   Estradiol (VAGIFEM) 10 MCG TABS vaginal tablet   lisinopril-hydrochlorothiazide (PRINZIDE,ZESTORETIC) 10-12.5 MG tablet   Other Relevant Orders   Flu Vaccine QUAD 36+ mos IM (Completed)   Menopause       Relevant Medications   Estradiol (VAGIFEM) 10 MCG TABS vaginal tablet      Follow-up: Return in about 6 months (around 10/10/2016) for annual exam, fasting.  Ann Held, DO

## 2016-08-12 ENCOUNTER — Other Ambulatory Visit: Payer: Self-pay | Admitting: Family Medicine

## 2016-11-30 ENCOUNTER — Other Ambulatory Visit: Payer: Self-pay | Admitting: Family Medicine

## 2016-11-30 DIAGNOSIS — I10 Essential (primary) hypertension: Secondary | ICD-10-CM

## 2017-01-18 DIAGNOSIS — Z01419 Encounter for gynecological examination (general) (routine) without abnormal findings: Secondary | ICD-10-CM | POA: Diagnosis not present

## 2017-01-18 DIAGNOSIS — Z1151 Encounter for screening for human papillomavirus (HPV): Secondary | ICD-10-CM | POA: Diagnosis not present

## 2017-01-18 DIAGNOSIS — Z6823 Body mass index (BMI) 23.0-23.9, adult: Secondary | ICD-10-CM | POA: Diagnosis not present

## 2017-01-18 LAB — HM PAP SMEAR: HM PAP: NORMAL

## 2017-02-15 DIAGNOSIS — H01114 Allergic dermatitis of left upper eyelid: Secondary | ICD-10-CM | POA: Diagnosis not present

## 2017-02-15 DIAGNOSIS — H10411 Chronic giant papillary conjunctivitis, right eye: Secondary | ICD-10-CM | POA: Diagnosis not present

## 2017-02-15 DIAGNOSIS — H01111 Allergic dermatitis of right upper eyelid: Secondary | ICD-10-CM | POA: Diagnosis not present

## 2017-02-28 DIAGNOSIS — K5900 Constipation, unspecified: Secondary | ICD-10-CM | POA: Diagnosis not present

## 2017-02-28 DIAGNOSIS — K625 Hemorrhage of anus and rectum: Secondary | ICD-10-CM | POA: Diagnosis not present

## 2017-03-07 DIAGNOSIS — K648 Other hemorrhoids: Secondary | ICD-10-CM | POA: Diagnosis not present

## 2017-03-07 DIAGNOSIS — K625 Hemorrhage of anus and rectum: Secondary | ICD-10-CM | POA: Diagnosis not present

## 2017-03-07 DIAGNOSIS — K635 Polyp of colon: Secondary | ICD-10-CM | POA: Diagnosis not present

## 2017-03-07 LAB — HM COLONOSCOPY

## 2017-03-10 DIAGNOSIS — K635 Polyp of colon: Secondary | ICD-10-CM | POA: Diagnosis not present

## 2017-03-14 ENCOUNTER — Telehealth: Payer: Self-pay | Admitting: Family Medicine

## 2017-03-14 ENCOUNTER — Other Ambulatory Visit: Payer: Self-pay | Admitting: Obstetrics and Gynecology

## 2017-03-14 DIAGNOSIS — Z1231 Encounter for screening mammogram for malignant neoplasm of breast: Secondary | ICD-10-CM

## 2017-03-14 NOTE — Telephone Encounter (Signed)
We will need to see her formulary or she can call her ins co and see what they will cover that is similar

## 2017-03-14 NOTE — Telephone Encounter (Signed)
Relation to [id number]:[id number] Call back number: [phone]  Pharmacy: [id number] [place], [place] [phone] (Phone) [phone] (Fax)     Reason for call:  Patient states ADVAIR DISKUS 100-50 MCG/DOSE AEPB is to expensive and symbicort is costly, patient seeking a 30 day supply alternate script.

## 2017-03-15 NOTE — Telephone Encounter (Signed)
Called the patient left a detailed message of PCP instructions. 

## 2017-03-21 ENCOUNTER — Telehealth: Payer: Self-pay | Admitting: Family Medicine

## 2017-03-21 NOTE — Telephone Encounter (Signed)
Relation to JQ:GBEE Call back [phone] Pharmacy: [id number] - [place], [place] [phone] (Phone) [phone] (Fax)     Reason for call:  Patient requesting 30 day supply ADVAIR DISKUS 100-50 MCG/DOSE AEPB stating [facility] scripts cost to much,[facility]

## 2017-03-23 ENCOUNTER — Other Ambulatory Visit: Payer: Self-pay

## 2017-03-23 MED ORDER — FLUTICASONE-SALMETEROL 100-50 MCG/DOSE IN AEPB: INHALATION_SPRAY | RESPIRATORY_TRACT | 2 refills | Status: DC

## 2017-03-31 MED ORDER — FLUTICASONE-SALMETEROL 100-50 MCG/DOSE IN AEPB: 1.0000 | INHALATION_SPRAY | Freq: Two times a day (BID) | RESPIRATORY_TRACT | 0 refills | Status: DC

## 2017-03-31 NOTE — Telephone Encounter (Signed)
Pt [phone]. Pt request 90 day supply ADVAIR DISKUS 100-50 MCG/DOSE AEPB in a PAPER (Printed) script for her to pick up. Pt says she has found a mail in on line company that will fill it for much less. Pt would like to PICK UP SCRIPT here. Please call when ready.

## 2017-03-31 NOTE — Telephone Encounter (Signed)
Patient notified that rx is ready for pickup and that she needs to return for a follow up sometime soon.

## 2017-04-11 ENCOUNTER — Ambulatory Visit
Admission: RE | Admit: 2017-04-11 | Discharge: 2017-04-11 | Disposition: A | Discharge status: Home / Self Care | Payer: BLUE CROSS/BLUE SHIELD | Source: Ambulatory Visit | Attending: Obstetrics and Gynecology | Admitting: Obstetrics and Gynecology

## 2017-04-11 DIAGNOSIS — Z1231 Encounter for screening mammogram for malignant neoplasm of breast: Secondary | ICD-10-CM | POA: Diagnosis not present

## 2017-04-13 ENCOUNTER — Ambulatory Visit (INDEPENDENT_AMBULATORY_CARE_PROVIDER_SITE_OTHER): Payer: BLUE CROSS/BLUE SHIELD

## 2017-04-13 DIAGNOSIS — Z23 Encounter for immunization: Secondary | ICD-10-CM | POA: Diagnosis not present

## 2017-05-17 ENCOUNTER — Encounter: Payer: Self-pay | Admitting: *Deleted

## 2017-05-17 DIAGNOSIS — D122 Benign neoplasm of ascending colon: Principal | ICD-10-CM

## 2017-05-17 DIAGNOSIS — K635 Polyp of colon: Secondary | ICD-10-CM

## 2017-06-08 ENCOUNTER — Other Ambulatory Visit: Payer: Self-pay | Admitting: Family Medicine

## 2017-06-08 DIAGNOSIS — I10 Essential (primary) hypertension: Secondary | ICD-10-CM

## 2017-10-04 ENCOUNTER — Telehealth: Payer: Self-pay | Admitting: Family Medicine

## 2017-10-04 DIAGNOSIS — Z780 Asymptomatic menopausal state: Secondary | ICD-10-CM

## 2017-10-04 DIAGNOSIS — I10 Essential (primary) hypertension: Secondary | ICD-10-CM

## 2017-10-04 NOTE — Telephone Encounter (Signed)
Copied from [place]. Topic: Quick Communication - Rx Refill/Question >> Oct 04, 2017  5:10 PM Tina Petersen wrote: Medication:  Estradiol (VAGIFEM) 10 MCG TABS vaginal tablet [[id number]] - please send to [facility] - 90 day supply  Fluticasone-Salmeterol (ADVAIR) 100-50 MCG/DOSE AEPB [[id number]- [facility]  Has the patient contacted their pharmacy? No  (Agent: If no, request that the patient contact the pharmacy for the refill.) Preferred Pharmacy (with phone number [facility] street [facility]  Agent: Please be advised that RX refills may take up to 3 business days. We ask that you follow-up with your pharmacy.

## 2017-10-05 MED ORDER — FLUTICASONE-SALMETEROL 100-50 MCG/DOSE IN AEPB: 1.0000 | INHALATION_SPRAY | Freq: Two times a day (BID) | RESPIRATORY_TRACT | 0 refills | Status: DC

## 2017-10-05 NOTE — Telephone Encounter (Signed)
Estradiol refill Last OV:04/12/16; Upcoming OV 12/05/17 Last refill: 04/12/16 12 tab/3 refill PRX:[id number] [facility]acy: [id number] [place], [place] - [place] [phone] (Phone) [phone] (Fax)

## 2017-10-06 MED ORDER — ESTRADIOL 10 MCG VA TABS: ORAL_TABLET | VAGINAL | 0 refills | Status: DC

## 2017-10-06 NOTE — Addendum Note (Signed)
Addended by: Kem Boroughs D on: 10/06/2017 11:52 AM   Modules accepted: Orders

## 2017-10-06 NOTE — Telephone Encounter (Signed)
Rx refilled and advised that she must keep appt for more refills

## 2017-11-11 ENCOUNTER — Encounter: Payer: BLUE CROSS/BLUE SHIELD | Admitting: Family Medicine

## 2017-12-05 ENCOUNTER — Encounter: Payer: Self-pay | Admitting: Family Medicine

## 2017-12-05 ENCOUNTER — Ambulatory Visit (INDEPENDENT_AMBULATORY_CARE_PROVIDER_SITE_OTHER): Payer: BLUE CROSS/BLUE SHIELD | Admitting: Family Medicine

## 2017-12-05 VITALS — BP 122/80 | HR 67 | Temp 98.4°F | Resp 16 | Ht 67.7 in | Wt 143.0 lb

## 2017-12-05 DIAGNOSIS — I10 Essential (primary) hypertension: Secondary | ICD-10-CM

## 2017-12-05 DIAGNOSIS — Z0000 Encounter for general adult medical examination without abnormal findings: Secondary | ICD-10-CM

## 2017-12-05 DIAGNOSIS — Z114 Encounter for screening for human immunodeficiency virus [HIV]: Secondary | ICD-10-CM

## 2017-12-05 DIAGNOSIS — Z23 Encounter for immunization: Secondary | ICD-10-CM

## 2017-12-05 DIAGNOSIS — Z1159 Encounter for screening for other viral diseases: Secondary | ICD-10-CM | POA: Diagnosis not present

## 2017-12-05 MED ORDER — LISINOPRIL-HYDROCHLOROTHIAZIDE 10-12.5 MG PO TABS: 1.0000 | ORAL_TABLET | Freq: Every day | ORAL | 1 refills | Status: DC

## 2017-12-05 NOTE — Progress Notes (Signed)
Subjective:     Tina Petersen is a 57 y.o. female and is here for a comprehensive physical exam. The patient reports no problems.  Social History   Socioeconomic History  . Marital status: Married    Spouse name: Not on file  . Number of children: 2  . Years of education: Not on file  . Highest education level: Not on file  Occupational History  . Occupation: Cabin crew  Social Needs  . Financial resource strain: Not on file  . Food insecurity:    Worry: Not on file    Inability: Not on file  . Transportation needs:    Medical: Not on file    Non-medical: Not on file  Tobacco Use  . Smoking status: Former Smoker    Last attempt to quit: 06/09/1989    Years since quitting: 28.5  . Smokeless tobacco: Former Network engineer and Sexual Activity  . Alcohol use: Yes    Alcohol/week: 6.0 oz    Types: 10 Glasses of wine per week  . Drug use: No  . Sexual activity: Yes    Partners: Male  Lifestyle  . Physical activity:    Days per week: Not on file    Minutes per session: Not on file  . Stress: Not on file  Relationships  . Social connections:    Talks on phone: Not on file    Gets together: Not on file    Attends religious service: Not on file    Active member of club or organization: Not on file    Attends meetings of clubs or organizations: Not on file    Relationship status: Not on file  . Intimate partner violence:    Fear of current or ex partner: Not on file    Emotionally abused: Not on file    Physically abused: Not on file    Forced sexual activity: Not on file  Other Topics Concern  . Not on file  Social History Narrative  . Not on file   Health Maintenance  Topic Date Due  . Hepatitis C Screening  09-22-1960  . HIV Screening  09/01/1974  . PAP SMEAR  06/13/2016  . INFLUENZA VACCINE  12/29/2017  . MAMMOGRAM  04/11/2018  . TETANUS/TDAP  12/25/2023  . COLONOSCOPY  03/08/2027    The following portions of the patient's history were reviewed and updated  as appropriate:  She  has a past medical history of Hypertension. She does not have any pertinent problems on file. She  has no past surgical history on file. Her family history includes Alcohol abuse in her father; Alzheimer's disease (age of onset: 29) in her father; Cancer in her maternal uncle and paternal aunt; Heart attack (age of onset: 63) in her father; Hyperlipidemia in her father; Hypertension in her father. She  reports that she quit smoking about 28 years ago. She has quit using smokeless tobacco. She reports that she drinks about 6.0 oz of alcohol per week. She reports that she does not use drugs. She has a current medication list which includes the following prescription(s): albuterol, aspirin, azelastine, b complex vitamins, vitamin d3, estradiol, fexofenadine, fluticasone, fluticasone-salmeterol, glucosamine-chondroit-vit c-mn, multivitamin with minerals, omeprazole, and lisinopril-hydrochlorothiazide. Current Outpatient Medications on File Prior to Visit  Medication Sig Dispense Refill  . albuterol (PROVENTIL HFA;VENTOLIN HFA) 108 (90 Base) MCG/ACT inhaler Inhale 2 puffs into the lungs every 6 (six) hours as needed for wheezing. 3 Inhaler 1  . aspirin 81 MG tablet Take 81  mg by mouth daily.    Marland Kitchen azelastine (ASTELIN) 0.1 % nasal spray Place 2 sprays into both nostrils at bedtime as needed for rhinitis. Use in each nostril as directed 30 mL 3  . b complex vitamins tablet Take 1 tablet by mouth daily.    . Cholecalciferol (VITAMIN D3) 2000 units TABS Take 1 tablet by mouth daily.    . Estradiol (VAGIFEM) 10 MCG TABS vaginal tablet 1 pv weekly 12 tablet 0  . fexofenadine (ALLEGRA) 60 MG tablet Take 60 mg by mouth 2 (two) times daily as needed.     . fluticasone (FLONASE) 50 MCG/ACT nasal spray Place 2 sprays into the nose daily. 48 g 3  . Fluticasone-Salmeterol (ADVAIR) 100-50 MCG/DOSE AEPB Inhale 1 puff into the lungs 2 (two) times daily. 2 each 0  . Glucosamine-Chondroit-Vit C-Mn  (GLUCOSAMINE 1500 COMPLEX PO) Take 1 tablet by mouth daily.    . Multiple Vitamins-Minerals (MULTIVITAMIN WITH MINERALS) tablet Take 1 tablet by mouth daily.    Marland Kitchen omeprazole (PRILOSEC) 20 MG capsule TAKE 1 CAPSULE DAILY 90 capsule 2   No current facility-administered medications on file prior to visit.    She is allergic to iohexol and nitrofurantoin..  Review of Systems Review of Systems  Constitutional: Negative for activity change, appetite change and fatigue.  HENT: Negative for hearing loss, congestion, tinnitus and ear discharge.  dentist q13m Eyes: Negative for visual disturbance (see optho q1y -- vision corrected to 20/20 with glasses).  Respiratory: Negative for cough, chest tightness and shortness of breath.   Cardiovascular: Negative for chest pain, palpitations and leg swelling.  Gastrointestinal: Negative for abdominal pain, diarrhea, constipation and abdominal distention.  Genitourinary: Negative for urgency, frequency, decreased urine volume and difficulty urinating.  Musculoskeletal: Negative for back pain, arthralgias and gait problem.  Skin: Negative for color change, pallor and rash.  Neurological: Negative for dizziness, light-headedness, numbness and headaches.  Hematological: Negative for adenopathy. Does not bruise/bleed easily.  Psychiatric/Behavioral: Negative for suicidal ideas, confusion, sleep disturbance, self-injury, dysphoric mood, decreased concentration and agitation.       Objective:    BP 122/80 (BP Location: Right Arm, Cuff Size: Normal)   Pulse 67   Temp 98.4 F (36.9 C) (Oral)   Resp 16   Ht 5' 7.7" (1.72 m)   Wt 143 lb (64.9 kg)   LMP 08/12/2010   SpO2 97%   BMI 21.94 kg/m  General appearance: alert, cooperative, appears stated age and no distress Head: Normocephalic, without obvious abnormality, atraumatic Eyes: negative findings: lids and lashes normal and pupils equal, round, reactive to light and accomodation Ears: normal TM's and  external ear canals both ears Nose: Nares normal. Septum midline. Mucosa normal. No drainage or sinus tenderness. Throat: lips, mucosa, and tongue normal; teeth and gums normal Neck: no adenopathy, no carotid bruit, no JVD, supple, symmetrical, trachea midline and thyroid not enlarged, symmetric, no tenderness/mass/nodules Back: symmetric, no curvature. ROM normal. No CVA tenderness. Lungs: clear to auscultation bilaterally Breasts: gyn Heart: regular rate and rhythm, S1, S2 normal, no murmur, click, rub or gallop Abdomen: soft, non-tender; bowel sounds normal; no masses,  no organomegaly Pelvic: deferred--gyn Extremities: extremities normal, atraumatic, no cyanosis or edema Pulses: 2+ and symmetric Skin: Skin color, texture, turgor normal. No rashes or lesions Lymph nodes: Cervical, supraclavicular, and axillary nodes normal. Neurologic: Alert and oriented X 3, normal strength and tone. Normal symmetric reflexes. Normal coordination and gait    Assessment:    Healthy female exam.  Plan:  ghm utd Check labs See After Visit Summary for Counseling Recommendations    1. Essential hypertension Well controlled, no changes to meds. Encouraged heart healthy diet such as the DASH diet and exercise as tolerated.  - lisinopril-hydrochlorothiazide (PRINZIDE,ZESTORETIC) 10-12.5 MG tablet; Take 1 tablet by mouth daily.  Dispense: 90 tablet; Refill: 1  2. Screening for HIV without presence of risk factors   - HIV antibody  3. Encounter for hepatitis C screening test for low risk patient   - Hepatitis C antibody  4. Preventative health care See above - Comprehensive metabolic panel - Lipid panel - CBC with Differential/Platelet - TSH  5. Need for shingles vaccine   - Varicella-zoster vaccine IM (Shingrix)

## 2017-12-05 NOTE — Patient Instructions (Signed)
Preventive Care 40-64 Years, Female Preventive care refers to lifestyle choices and visits with your health care provider that can promote health and wellness. What does preventive care include?  A yearly physical exam. This is also called an annual well check.  Dental exams once or twice a year.  Routine eye exams. Ask your health care provider how often you should have your eyes checked.  Personal lifestyle choices, including: ? Daily care of your teeth and gums. ? Regular physical activity. ? Eating a healthy diet. ? Avoiding tobacco and drug use. ? Limiting alcohol use. ? Practicing safe sex. ? Taking low-dose aspirin daily starting at age 58. ? Taking vitamin and mineral supplements as recommended by your health care provider. What happens during an annual well check? The services and screenings done by your health care provider during your annual well check will depend on your age, overall health, lifestyle risk factors, and family history of disease. Counseling Your health care provider may ask you questions about your:  Alcohol use.  Tobacco use.  Drug use.  Emotional well-being.  Home and relationship well-being.  Sexual activity.  Eating habits.  Work and work Statistician.  Method of birth control.  Menstrual cycle.  Pregnancy history.  Screening You may have the following tests or measurements:  Height, weight, and BMI.  Blood pressure.  Lipid and cholesterol levels. These may be checked every 5 years, or more frequently if you are over 81 years old.  Skin check.  Lung cancer screening. You may have this screening every year starting at age 78 if you have a 30-pack-year history of smoking and currently smoke or have quit within the past 15 years.  Fecal occult blood test (FOBT) of the stool. You may have this test every year starting at age 65.  Flexible sigmoidoscopy or colonoscopy. You may have a sigmoidoscopy every 5 years or a colonoscopy  every 10 years starting at age 30.  Hepatitis C blood test.  Hepatitis B blood test.  Sexually transmitted disease (STD) testing.  Diabetes screening. This is done by checking your blood sugar (glucose) after you have not eaten for a while (fasting). You may have this done every 1-3 years.  Mammogram. This may be done every 1-2 years. Talk to your health care provider about when you should start having regular mammograms. This may depend on whether you have a family history of breast cancer.  BRCA-related cancer screening. This may be done if you have a family history of breast, ovarian, tubal, or peritoneal cancers.  Pelvic exam and Pap test. This may be done every 3 years starting at age 80. Starting at age 36, this may be done every 5 years if you have a Pap test in combination with an HPV test.  Bone density scan. This is done to screen for osteoporosis. You may have this scan if you are at high risk for osteoporosis.  Discuss your test results, treatment options, and if necessary, the need for more tests with your health care provider. Vaccines Your health care provider may recommend certain vaccines, such as:  Influenza vaccine. This is recommended every year.  Tetanus, diphtheria, and acellular pertussis (Tdap, Td) vaccine. You may need a Td booster every 10 years.  Varicella vaccine. You may need this if you have not been vaccinated.  Zoster vaccine. You may need this after age 5.  Measles, mumps, and rubella (MMR) vaccine. You may need at least one dose of MMR if you were born in  1957 or later. You may also need a second dose.  Pneumococcal 13-valent conjugate (PCV13) vaccine. You may need this if you have certain conditions and were not previously vaccinated.  Pneumococcal polysaccharide (PPSV23) vaccine. You may need one or two doses if you smoke cigarettes or if you have certain conditions.  Meningococcal vaccine. You may need this if you have certain  conditions.  Hepatitis A vaccine. You may need this if you have certain conditions or if you travel or work in places where you may be exposed to hepatitis A.  Hepatitis B vaccine. You may need this if you have certain conditions or if you travel or work in places where you may be exposed to hepatitis B.  Haemophilus influenzae type b (Hib) vaccine. You may need this if you have certain conditions.  Talk to your health care provider about which screenings and vaccines you need and how often you need them. This information is not intended to replace advice given to you by your health care provider. Make sure you discuss any questions you have with your health care provider. Document Released: 06/13/2015 Document Revised: 02/04/2016 Document Reviewed: 03/18/2015 Elsevier Interactive Patient Education  2018 [employer]  

## 2017-12-06 ENCOUNTER — Encounter: Payer: Self-pay | Admitting: *Deleted

## 2017-12-06 LAB — COMPREHENSIVE METABOLIC PANEL
ALBUMIN: 4.3 g/dL (ref 3.5–5.2)
ALT: 13 U/L (ref 0–35)
AST: 17 U/L (ref 0–37)
Alkaline Phosphatase: 45 U/L (ref 39–117)
BILIRUBIN TOTAL: 0.5 mg/dL (ref 0.2–1.2)
BUN: 10 mg/dL (ref 6–23)
CALCIUM: 9.3 mg/dL (ref 8.4–10.5)
CO2: 31 mEq/L (ref 19–32)
CREATININE: 0.69 mg/dL (ref 0.40–1.20)
Chloride: 98 mEq/L (ref 96–112)
GFR: 92.79 mL/min (ref >60.00)
Glucose, Bld: 93 mg/dL (ref 70–99)
Potassium: 3.8 mEq/L (ref 3.5–5.1)
Sodium: 136 mEq/L (ref 135–145)
Total Protein: 7.1 g/dL (ref 6.0–8.3)

## 2017-12-06 LAB — TSH: TSH: 2.41 u[IU]/mL (ref 0.35–4.50)

## 2017-12-06 LAB — HEPATITIS C ANTIBODY
Hepatitis C Ab: NONREACTIVE
SIGNAL TO CUT-OFF: 0.01 (ref <1.00)

## 2017-12-06 LAB — CBC WITH DIFFERENTIAL/PLATELET
BASOS PCT: 1.2 % (ref 0.0–3.0)
Basophils Absolute: 0.1 10*3/uL (ref 0.0–0.1)
EOS ABS: 0.2 10*3/uL (ref 0.0–0.7)
Eosinophils Relative: 4.2 % (ref 0.0–5.0)
HCT: 42.6 % (ref 36.0–46.0)
HEMOGLOBIN: 14.7 g/dL (ref 12.0–15.0)
Lymphocytes Relative: 37 % (ref 12.0–46.0)
Lymphs Abs: 1.9 10*3/uL (ref 0.7–4.0)
MCHC: 34.6 g/dL (ref 30.0–36.0)
MCV: 98.6 fl (ref 78.0–100.0)
MONO ABS: 0.5 10*3/uL (ref 0.1–1.0)
Monocytes Relative: 9.1 % (ref 3.0–12.0)
Neutro Abs: 2.5 10*3/uL (ref 1.4–7.7)
Neutrophils Relative %: 48.5 % (ref 43.0–77.0)
PLATELETS: 347 10*3/uL (ref 150.0–400.0)
RBC: 4.32 Mil/uL (ref 3.87–5.11)
RDW: 13.2 % (ref 11.5–15.5)
WBC: 5.1 10*3/uL (ref 4.0–10.5)

## 2017-12-06 LAB — LIPID PANEL
CHOLESTEROL: 199 mg/dL (ref 0–200)
HDL: 81.1 mg/dL (ref >39.00)
LDL Cholesterol: 98 mg/dL (ref 0–99)
NonHDL: 117.64
TRIGLYCERIDES: 96 mg/dL (ref 0.0–149.0)
Total CHOL/HDL Ratio: 2
VLDL: 19.2 mg/dL (ref 0.0–40.0)

## 2017-12-06 LAB — HIV ANTIBODY (ROUTINE TESTING W REFLEX): HIV: NONREACTIVE

## 2017-12-28 ENCOUNTER — Other Ambulatory Visit: Payer: Self-pay | Admitting: Family Medicine

## 2017-12-28 DIAGNOSIS — Z780 Asymptomatic menopausal state: Secondary | ICD-10-CM

## 2018-02-08 ENCOUNTER — Ambulatory Visit (INDEPENDENT_AMBULATORY_CARE_PROVIDER_SITE_OTHER): Payer: BLUE CROSS/BLUE SHIELD

## 2018-02-08 DIAGNOSIS — Z23 Encounter for immunization: Secondary | ICD-10-CM

## 2018-02-08 NOTE — Progress Notes (Addendum)
Pre visit review using our clinic tool,if applicable. No additional management support is needed unless otherwise documented below in the visit note.   Patient states she will only have Shingrix injection today and she does not want Pneumococcal today. States she will get next month.

## 2018-02-23 DIAGNOSIS — Z01419 Encounter for gynecological examination (general) (routine) without abnormal findings: Secondary | ICD-10-CM | POA: Diagnosis not present

## 2018-02-23 DIAGNOSIS — Z6822 Body mass index (BMI) 22.0-22.9, adult: Secondary | ICD-10-CM | POA: Diagnosis not present

## 2018-03-20 ENCOUNTER — Other Ambulatory Visit: Payer: Self-pay | Admitting: Obstetrics and Gynecology

## 2018-03-20 DIAGNOSIS — Z1231 Encounter for screening mammogram for malignant neoplasm of breast: Secondary | ICD-10-CM

## 2018-03-31 ENCOUNTER — Other Ambulatory Visit: Payer: Self-pay | Admitting: Family Medicine

## 2018-03-31 MED ORDER — FLUTICASONE-SALMETEROL 100-50 MCG/DOSE IN AEPB: 1.0000 | INHALATION_SPRAY | Freq: Two times a day (BID) | RESPIRATORY_TRACT | 12 refills | Status: DC

## 2018-04-04 ENCOUNTER — Other Ambulatory Visit: Payer: Self-pay | Admitting: *Deleted

## 2018-04-04 MED ORDER — FLUTICASONE-SALMETEROL 100-50 MCG/DOSE IN AEPB: 1.0000 | INHALATION_SPRAY | Freq: Two times a day (BID) | RESPIRATORY_TRACT | 1 refills | Status: DC

## 2018-04-17 ENCOUNTER — Ambulatory Visit (INDEPENDENT_AMBULATORY_CARE_PROVIDER_SITE_OTHER): Payer: BLUE CROSS/BLUE SHIELD

## 2018-04-17 DIAGNOSIS — Z23 Encounter for immunization: Secondary | ICD-10-CM

## 2018-04-24 ENCOUNTER — Ambulatory Visit
Admission: RE | Admit: 2018-04-24 | Discharge: 2018-04-24 | Disposition: A | Discharge status: Home / Self Care | Payer: BLUE CROSS/BLUE SHIELD | Source: Ambulatory Visit | Attending: Obstetrics and Gynecology | Admitting: Obstetrics and Gynecology

## 2018-04-24 DIAGNOSIS — Z1231 Encounter for screening mammogram for malignant neoplasm of breast: Secondary | ICD-10-CM | POA: Diagnosis not present

## 2018-04-25 ENCOUNTER — Other Ambulatory Visit: Payer: Self-pay | Admitting: Family Medicine

## 2018-04-25 DIAGNOSIS — Z780 Asymptomatic menopausal state: Secondary | ICD-10-CM

## 2018-05-18 DIAGNOSIS — D225 Melanocytic nevi of trunk: Secondary | ICD-10-CM | POA: Diagnosis not present

## 2018-05-18 DIAGNOSIS — L578 Other skin changes due to chronic exposure to nonionizing radiation: Secondary | ICD-10-CM | POA: Diagnosis not present

## 2018-05-18 DIAGNOSIS — D2271 Melanocytic nevi of right lower limb, including hip: Secondary | ICD-10-CM | POA: Diagnosis not present

## 2018-05-18 DIAGNOSIS — D2272 Melanocytic nevi of left lower limb, including hip: Secondary | ICD-10-CM | POA: Diagnosis not present

## 2018-06-14 DIAGNOSIS — H10411 Chronic giant papillary conjunctivitis, right eye: Secondary | ICD-10-CM | POA: Diagnosis not present

## 2018-06-15 ENCOUNTER — Other Ambulatory Visit: Payer: Self-pay | Admitting: Family Medicine

## 2018-06-15 DIAGNOSIS — I10 Essential (primary) hypertension: Secondary | ICD-10-CM

## 2018-06-27 ENCOUNTER — Ambulatory Visit (INDEPENDENT_AMBULATORY_CARE_PROVIDER_SITE_OTHER): Payer: BLUE CROSS/BLUE SHIELD | Admitting: Family Medicine

## 2018-06-27 ENCOUNTER — Encounter: Payer: Self-pay | Admitting: Family Medicine

## 2018-06-27 VITALS — BP 116/72 | HR 89 | Resp 16 | Ht 67.5 in | Wt 149.0 lb

## 2018-06-27 DIAGNOSIS — I10 Essential (primary) hypertension: Secondary | ICD-10-CM

## 2018-06-27 MED ORDER — ALBUTEROL SULFATE HFA 108 (90 BASE) MCG/ACT IN AERS: 2.0000 | INHALATION_SPRAY | Freq: Four times a day (QID) | RESPIRATORY_TRACT | 1 refills | Status: DC | PRN

## 2018-06-27 NOTE — Patient Instructions (Signed)

## 2018-06-28 NOTE — Assessment & Plan Note (Signed)
Well controlled, no changes to meds. Encouraged heart healthy diet such as the DASH diet and exercise as tolerated.  °

## 2018-06-28 NOTE — Progress Notes (Signed)
Patient ID: MELESA LECY, female    DOB: [birth year]  Age: 58 y.o. MRN: [id number]    Subjective:  Subjective  HPI Tina Petersen presents for f/u bp.  No complaints.    Review of Systems  Constitutional: Negative for appetite change, diaphoresis, fatigue and unexpected weight change.  Eyes: Negative for pain, redness and visual disturbance.  Respiratory: Negative for cough, chest tightness, shortness of breath and wheezing.   Cardiovascular: Negative for chest pain, palpitations and leg swelling.  Endocrine: Negative for cold intolerance, heat intolerance, polydipsia, polyphagia and polyuria.  Genitourinary: Negative for difficulty urinating, dysuria and frequency.  Neurological: Negative for dizziness, light-headedness, numbness and headaches.    History Past Medical History:  Diagnosis Date  . Hypertension     She has no past surgical history on file.   Her family history includes Alcohol abuse in her father; Alzheimer's disease (age of onset: 50) in her father; Breast cancer in her paternal aunt; Cancer in her maternal uncle and paternal aunt; Heart attack (age of onset: 12) in her father; Hyperlipidemia in her father; Hypertension in her father.She reports that she quit smoking about 29 years ago. She has quit using smokeless tobacco. She reports current alcohol use of about 10.0 standard drinks of alcohol per week. She reports that she does not use drugs.  Current Outpatient Medications on File Prior to Visit  Medication Sig Dispense Refill  . aspirin 81 MG tablet Take 81 mg by mouth daily.    Marland Kitchen azelastine (ASTELIN) 0.1 % nasal spray Place 2 sprays into both nostrils at bedtime as needed for rhinitis. Use in each nostril as directed 30 mL 3  . b complex vitamins tablet Take 1 tablet by mouth daily.    . Cholecalciferol (VITAMIN D3) 2000 units TABS Take 1 tablet by mouth daily.    . fexofenadine (ALLEGRA) 60 MG tablet Take 60 mg by mouth 2 (two) times daily as needed.     .  fluticasone (FLONASE) 50 MCG/ACT nasal spray Place 2 sprays into the nose daily. 48 g 3  . Fluticasone-Salmeterol (ADVAIR) 100-50 MCG/DOSE AEPB Inhale 1 puff into the lungs 2 (two) times daily. 180 each 1  . Glucosamine-Chondroit-Vit C-Mn (GLUCOSAMINE 1500 COMPLEX PO) Take 1 tablet by mouth daily.    Marland Kitchen lisinopril-hydrochlorothiazide (PRINZIDE,ZESTORETIC) 10-12.5 MG tablet TAKE 1 TABLET DAILY 90 tablet 1  . Multiple Vitamins-Minerals (MULTIVITAMIN WITH MINERALS) tablet Take 1 tablet by mouth daily.    Marland Kitchen omeprazole (PRILOSEC) 20 MG capsule TAKE 1 CAPSULE DAILY 90 capsule 2  . YUVAFEM 10 MCG TABS vaginal tablet INSERT 1 TABLET VAGINALLY WEEKLY 12 tablet 1   No current facility-administered medications on file prior to visit.      Objective:  Objective  Physical Exam Vitals signs and nursing note reviewed.  Constitutional:      Appearance: She is well-developed.  HENT:     Head: Normocephalic and atraumatic.  Eyes:     Conjunctiva/sclera: Conjunctivae normal.  Neck:     Musculoskeletal: Normal range of motion and neck supple.     Thyroid: No thyromegaly.     Vascular: No carotid bruit or JVD.  Cardiovascular:     Rate and Rhythm: Normal rate and regular rhythm.     Heart sounds: Normal heart sounds. No murmur.  Pulmonary:     Effort: Pulmonary effort is normal. No respiratory distress.     Breath sounds: Normal breath sounds. No wheezing or rales.  Chest:     Chest wall:  No tenderness.  Neurological:     Mental Status: She is alert and oriented to person, place, and time.    BP 116/72 (BP Location: Left Arm, Patient Position: Sitting, Cuff Size: Normal)   Pulse 89   Resp 16   Ht 5' 7.5" (1.715 m)   Wt 149 lb (67.6 kg)   LMP 08/12/2010   SpO2 96%   BMI 22.99 kg/m  Wt Readings from Last 3 Encounters:  06/27/18 149 lb (67.6 kg)  12/05/17 143 lb (64.9 kg)  04/12/16 152 lb 6.4 oz (69.1 kg)     Lab Results  Component Value Date   WBC 5.1 12/05/2017   HGB 14.7 12/05/2017     HCT 42.6 12/05/2017   PLT 347.0 12/05/2017   GLUCOSE 93 12/05/2017   CHOL 199 12/05/2017   TRIG 96.0 12/05/2017   HDL 81.10 12/05/2017   LDLDIRECT 125.6 08/19/2010   LDLCALC 98 12/05/2017   ALT 13 12/05/2017   AST 17 12/05/2017   NA 136 12/05/2017   K 3.8 12/05/2017   CL 98 12/05/2017   CREATININE 0.69 12/05/2017   BUN 10 12/05/2017   CO2 31 12/05/2017   TSH 2.41 12/05/2017    Mm 3d Screen Breast Bilateral  Result Date: 04/25/2018 CLINICAL DATA:  Screening. EXAM: DIGITAL SCREENING BILATERAL MAMMOGRAM WITH TOMO AND CAD COMPARISON:  Previous exam(s). ACR Breast Density Category b: There are scattered areas of fibroglandular density. FINDINGS: There are no findings suspicious for malignancy. Images were processed with CAD. IMPRESSION: No mammographic evidence of malignancy. A result letter of this screening mammogram will be mailed directly to the patient. RECOMMENDATION: Screening mammogram in one year. (Code:SM-B-01Y) BI-RADS CATEGORY  1: Negative. Electronically Signed   By: Lajean Manes M.D.   On: 04/25/2018 15:41     Assessment & Plan:  Plan  I am having Tina Petersen maintain her fexofenadine, fluticasone, aspirin, Glucosamine-Chondroit-Vit C-Mn (GLUCOSAMINE 1500 COMPLEX PO), multivitamin with minerals, b complex vitamins, Vitamin D3, azelastine, omeprazole, Fluticasone-Salmeterol, YUVAFEM, lisinopril-hydrochlorothiazide, and albuterol.  Meds ordered this encounter  Medications  . albuterol (PROVENTIL HFA;VENTOLIN HFA) 108 (90 Base) MCG/ACT inhaler    Sig: Inhale 2 puffs into the lungs every 6 (six) hours as needed for wheezing.    Dispense:  3 Inhaler    Refill:  1    Problem List Items Addressed This Visit      Medium   Essential hypertension - Primary    Well controlled, no changes to meds. Encouraged heart healthy diet such as the DASH diet and exercise as tolerated.       Relevant Orders   Lipid panel   Comprehensive metabolic panel      Follow-up:  Return in about 6 months (around 12/26/2018) for annual exam, fasting.  Ann Held, DO

## 2018-06-29 ENCOUNTER — Other Ambulatory Visit (INDEPENDENT_AMBULATORY_CARE_PROVIDER_SITE_OTHER): Payer: BLUE CROSS/BLUE SHIELD

## 2018-06-29 DIAGNOSIS — I10 Essential (primary) hypertension: Secondary | ICD-10-CM

## 2018-06-29 LAB — LIPID PANEL
CHOL/HDL RATIO: 3
Cholesterol: 220 mg/dL — ABNORMAL HIGH (ref 0–200)
HDL: 81.6 mg/dL (ref >39.00)
LDL CALC: 126 mg/dL — AB (ref 0–99)
NonHDL: 138.6
Triglycerides: 65 mg/dL (ref 0.0–149.0)
VLDL: 13 mg/dL (ref 0.0–40.0)

## 2018-06-29 LAB — COMPREHENSIVE METABOLIC PANEL
ALT: 15 U/L (ref 0–35)
AST: 16 U/L (ref 0–37)
Albumin: 4.5 g/dL (ref 3.5–5.2)
Alkaline Phosphatase: 45 U/L (ref 39–117)
BUN: 11 mg/dL (ref 6–23)
CO2: 31 meq/L (ref 19–32)
CREATININE: 0.72 mg/dL (ref 0.40–1.20)
Calcium: 9.4 mg/dL (ref 8.4–10.5)
Chloride: 98 mEq/L (ref 96–112)
GFR: 82.96 mL/min (ref >60.00)
GLUCOSE: 84 mg/dL (ref 70–99)
POTASSIUM: 4 meq/L (ref 3.5–5.1)
SODIUM: 135 meq/L (ref 135–145)
Total Bilirubin: 0.6 mg/dL (ref 0.2–1.2)
Total Protein: 7.1 g/dL (ref 6.0–8.3)

## 2018-10-15 ENCOUNTER — Encounter: Payer: Self-pay | Admitting: Family Medicine

## 2018-10-30 ENCOUNTER — Other Ambulatory Visit: Payer: Self-pay | Admitting: Family Medicine

## 2018-10-30 DIAGNOSIS — Z780 Asymptomatic menopausal state: Secondary | ICD-10-CM

## 2018-11-04 ENCOUNTER — Other Ambulatory Visit: Payer: Self-pay | Admitting: Family Medicine

## 2018-11-04 DIAGNOSIS — I10 Essential (primary) hypertension: Secondary | ICD-10-CM

## 2018-12-08 ENCOUNTER — Encounter: Payer: BLUE CROSS/BLUE SHIELD | Admitting: Family Medicine

## 2019-01-30 ENCOUNTER — Encounter: Payer: Self-pay | Admitting: Family Medicine

## 2019-01-30 ENCOUNTER — Ambulatory Visit (INDEPENDENT_AMBULATORY_CARE_PROVIDER_SITE_OTHER): Payer: BC Managed Care – PPO | Admitting: Family Medicine

## 2019-01-30 ENCOUNTER — Other Ambulatory Visit: Payer: Self-pay

## 2019-01-30 VITALS — BP 122/70 | HR 68 | Temp 97.4°F | Resp 12 | Ht 67.5 in | Wt 142.2 lb

## 2019-01-30 DIAGNOSIS — Z0000 Encounter for general adult medical examination without abnormal findings: Secondary | ICD-10-CM

## 2019-01-30 DIAGNOSIS — E2839 Other primary ovarian failure: Secondary | ICD-10-CM

## 2019-01-30 DIAGNOSIS — J4520 Mild intermittent asthma, uncomplicated: Secondary | ICD-10-CM | POA: Diagnosis not present

## 2019-01-30 DIAGNOSIS — I10 Essential (primary) hypertension: Secondary | ICD-10-CM | POA: Diagnosis not present

## 2019-01-30 LAB — LIPID PANEL
Cholesterol: 208 mg/dL — ABNORMAL HIGH (ref 0–200)
HDL: 84.8 mg/dL (ref >39.00)
LDL Cholesterol: 103 mg/dL — ABNORMAL HIGH (ref 0–99)
NonHDL: 123.62
Total CHOL/HDL Ratio: 2
Triglycerides: 104 mg/dL (ref 0.0–149.0)
VLDL: 20.8 mg/dL (ref 0.0–40.0)

## 2019-01-30 LAB — CBC WITH DIFFERENTIAL/PLATELET
Basophils Absolute: 0 10*3/uL (ref 0.0–0.1)
Basophils Relative: 0.5 % (ref 0.0–3.0)
Eosinophils Absolute: 0.2 10*3/uL (ref 0.0–0.7)
Eosinophils Relative: 3.7 % (ref 0.0–5.0)
HCT: 42.3 % (ref 36.0–46.0)
Hemoglobin: 14.1 g/dL (ref 12.0–15.0)
Lymphocytes Relative: 45.6 % (ref 12.0–46.0)
Lymphs Abs: 2.4 10*3/uL (ref 0.7–4.0)
MCHC: 33.4 g/dL (ref 30.0–36.0)
MCV: 98.3 fl (ref 78.0–100.0)
Monocytes Absolute: 0.5 10*3/uL (ref 0.1–1.0)
Monocytes Relative: 8.9 % (ref 3.0–12.0)
Neutro Abs: 2.2 10*3/uL (ref 1.4–7.7)
Neutrophils Relative %: 41.3 % — ABNORMAL LOW (ref 43.0–77.0)
Platelets: 329 10*3/uL (ref 150.0–400.0)
RBC: 4.3 Mil/uL (ref 3.87–5.11)
RDW: 12.6 % (ref 11.5–15.5)
WBC: 5.4 10*3/uL (ref 4.0–10.5)

## 2019-01-30 LAB — COMPREHENSIVE METABOLIC PANEL
ALT: 15 U/L (ref 0–35)
AST: 19 U/L (ref 0–37)
Albumin: 4.6 g/dL (ref 3.5–5.2)
Alkaline Phosphatase: 49 U/L (ref 39–117)
BUN: 8 mg/dL (ref 6–23)
CO2: 28 mEq/L (ref 19–32)
Calcium: 9.5 mg/dL (ref 8.4–10.5)
Chloride: 98 mEq/L (ref 96–112)
Creatinine, Ser: 0.64 mg/dL (ref 0.40–1.20)
GFR: 94.84 mL/min (ref >60.00)
Glucose, Bld: 85 mg/dL (ref 70–99)
Potassium: 4 mEq/L (ref 3.5–5.1)
Sodium: 136 mEq/L (ref 135–145)
Total Bilirubin: 0.6 mg/dL (ref 0.2–1.2)
Total Protein: 7.4 g/dL (ref 6.0–8.3)

## 2019-01-30 LAB — TSH: TSH: 3.36 u[IU]/mL (ref 0.35–4.50)

## 2019-01-30 MED ORDER — LISINOPRIL-HYDROCHLOROTHIAZIDE 10-12.5 MG PO TABS: 1.0000 | ORAL_TABLET | Freq: Every day | ORAL | 3 refills | Status: DC

## 2019-01-30 MED ORDER — FLUTICASONE-SALMETEROL 100-50 MCG/DOSE IN AEPB: 1.0000 | INHALATION_SPRAY | Freq: Two times a day (BID) | RESPIRATORY_TRACT | 1 refills | Status: DC

## 2019-01-30 NOTE — Progress Notes (Signed)
Subjective:     Tina Petersen is a 58 y.o. female and is here for a comprehensive physical exam. The patient reports no problems. She needs refills on meds for bp Social History   Socioeconomic History  . Marital status: Married    Spouse name: Not on file  . Number of children: 2  . Years of education: Not on file  . Highest education level: Not on file  Occupational History  . Occupation: Cabin crew  Social Needs  . Financial resource strain: Not on file  . Food insecurity    Worry: Not on file    Inability: Not on file  . Transportation needs    Medical: Not on file    Non-medical: Not on file  Tobacco Use  . Smoking status: Former Smoker    Quit date: 06/09/1989    Years since quitting: 29.6  . Smokeless tobacco: Former Network engineer and Sexual Activity  . Alcohol use: Yes    Alcohol/week: 10.0 standard drinks    Types: 10 Glasses of wine per week  . Drug use: No  . Sexual activity: Yes    Partners: Male  Lifestyle  . Physical activity    Days per week: Not on file    Minutes per session: Not on file  . Stress: Not on file  Relationships  . Social Herbalist on phone: Not on file    Gets together: Not on file    Attends religious service: Not on file    Active member of club or organization: Not on file    Attends meetings of clubs or organizations: Not on file    Relationship status: Not on file  . Intimate partner violence    Fear of current or ex partner: Not on file    Emotionally abused: Not on file    Physically abused: Not on file    Forced sexual activity: Not on file  Other Topics Concern  . Not on file  Social History Narrative  . Not on file   Health Maintenance  Topic Date Due  . INFLUENZA VACCINE  12/30/2018  . MAMMOGRAM  04/25/2019  . PAP SMEAR-Modifier  01/19/2020  . TETANUS/TDAP  12/25/2023  . COLONOSCOPY  03/08/2027  . Hepatitis C Screening  Completed  . HIV Screening  Completed    The following portions of the  patient's history were reviewed and updated as appropriate:  She  has a past medical history of Hypertension. She does not have any pertinent problems on file. She  has no past surgical history on file. Her family history includes Alcohol abuse in her father; Alzheimer's disease (age of onset: 58) in her father; Breast cancer in her paternal aunt; Cancer in her maternal uncle and paternal aunt; Heart attack (age of onset: 39) in her father; Hyperlipidemia in her father; Hypertension in her father. She  reports that she quit smoking about 29 years ago. She has quit using smokeless tobacco. She reports current alcohol use of about 10.0 standard drinks of alcohol per week. She reports that she does not use drugs. She has a current medication list which includes the following prescription(s): albuterol, aspirin, azelastine, b complex vitamins, vitamin d3, fexofenadine, fluticasone, fluticasone-salmeterol, glucosamine-chondroit-vit c-mn, lisinopril-hydrochlorothiazide, multivitamin with minerals, omeprazole, and yuvafem. Current Outpatient Medications on File Prior to Visit  Medication Sig Dispense Refill  . albuterol (PROVENTIL HFA;VENTOLIN HFA) 108 (90 Base) MCG/ACT inhaler Inhale 2 puffs into the lungs every 6 (six) hours as needed  for wheezing. 3 Inhaler 1  . aspirin 81 MG tablet Take 81 mg by mouth daily.    Marland Kitchen azelastine (ASTELIN) 0.1 % nasal spray Place 2 sprays into both nostrils at bedtime as needed for rhinitis. Use in each nostril as directed 30 mL 3  . b complex vitamins tablet Take 1 tablet by mouth daily.    . Cholecalciferol (VITAMIN D3) 2000 units TABS Take 1 tablet by mouth daily.    . fexofenadine (ALLEGRA) 60 MG tablet Take 60 mg by mouth 2 (two) times daily as needed.     . fluticasone (FLONASE) 50 MCG/ACT nasal spray Place 2 sprays into the nose daily. 48 g 3  . Fluticasone-Salmeterol (ADVAIR) 100-50 MCG/DOSE AEPB Inhale 1 puff into the lungs 2 (two) times daily. 180 each 1  .  Glucosamine-Chondroit-Vit C-Mn (GLUCOSAMINE 1500 COMPLEX PO) Take 1 tablet by mouth daily.    Marland Kitchen lisinopril-hydrochlorothiazide (ZESTORETIC) 10-12.5 MG tablet TAKE 1 TABLET DAILY 90 tablet 0  . Multiple Vitamins-Minerals (MULTIVITAMIN WITH MINERALS) tablet Take 1 tablet by mouth daily.    Marland Kitchen omeprazole (PRILOSEC) 20 MG capsule TAKE 1 CAPSULE DAILY 90 capsule 2  . YUVAFEM 10 MCG TABS vaginal tablet INSERT 1 TABLET VAGINALLY WEEKLY 12 tablet 1   No current facility-administered medications on file prior to visit.    She is allergic to iohexol and nitrofurantoin..  Review of Systems Review of Systems  Constitutional: Negative for activity change, appetite change and fatigue.  HENT: Negative for hearing loss, congestion, tinnitus and ear discharge.  dentist q59m Eyes: Negative for visual disturbance (see optho q1y -- vision corrected to 20/20 with glasses).  Respiratory: Negative for cough, chest tightness and shortness of breath.   Cardiovascular: Negative for chest pain, palpitations and leg swelling.  Gastrointestinal: Negative for abdominal pain, diarrhea, constipation and abdominal distention.  Genitourinary: Negative for urgency, frequency, decreased urine volume and difficulty urinating.  Musculoskeletal: Negative for back pain, arthralgias and gait problem.  Skin: Negative for color change, pallor and rash.  Neurological: Negative for dizziness, light-headedness, numbness and headaches.  Hematological: Negative for adenopathy. Does not bruise/bleed easily.  Psychiatric/Behavioral: Negative for suicidal ideas, confusion, sleep disturbance, self-injury, dysphoric mood, decreased concentration and agitation.       Objective:    BP 122/70 (BP Location: Right Arm, Cuff Size: Normal)   Pulse 68   Temp (!) 97.4 F (36.3 C) (Temporal)   Resp 12   Ht 5' 7.5" (1.715 m)   Wt 142 lb 3.2 oz (64.5 kg)   LMP 08/12/2010   SpO2 97%   BMI 21.94 kg/m  General appearance: alert, cooperative,  appears stated age and no distress Head: Normocephalic, without obvious abnormality, atraumatic Eyes: negative findings: lids and lashes normal, conjunctivae and sclerae normal and pupils equal, round, reactive to light and accomodation Ears: normal TM's and external ear canals both ears Nose: Nares normal. Septum midline. Mucosa normal. No drainage or sinus tenderness. Throat: lips, mucosa, and tongue normal; teeth and gums normal Neck: no adenopathy, no carotid bruit, no JVD, supple, symmetrical, trachea midline and thyroid not enlarged, symmetric, no tenderness/mass/nodules Back: symmetric, no curvature. ROM normal. No CVA tenderness. Lungs: clear to auscultation bilaterally Breasts: gyn Heart: regular rate and rhythm, S1, S2 normal, no murmur, click, rub or gallop Abdomen: soft, non-tender; bowel sounds normal; no masses,  no organomegaly Pelvic: deferred --gyn Extremities: extremities normal, atraumatic, no cyanosis or edema Pulses: 2+ and symmetric Skin: Skin color, texture, turgor normal. No rashes or lesions Lymph nodes:  Cervical, supraclavicular, and axillary nodes normal. Neurologic: Alert and oriented X 3, normal strength and tone. Normal symmetric reflexes. Normal coordination and gait    Assessment:    Healthy female exam.      Plan:    ghm utd Check labs  She will get the flu shot later  See After Visit Summary for Counseling Recommendations   1. Essential hypertension Well controlled, no changes to meds. Encouraged heart healthy diet such as the DASH diet and exercise as tolerated.   - lisinopril-hydrochlorothiazide (ZESTORETIC) 10-12.5 MG tablet; Take 1 tablet by mouth daily.  Dispense: 90 tablet; Refill: 3  2. Mild intermittent asthma, unspecified whether complicated Stable  - Fluticasone-Salmeterol (ADVAIR) 100-50 MCG/DOSE AEPB; Inhale 1 puff into the lungs 2 (two) times daily.  Dispense: 180 each; Refill: 1  3. Estrogen deficiency  - DG Bone Density;  Future  4. Preventative health care See above  - Lipid panel - CBC with Differential/Platelet - TSH - Comprehensive metabolic panel

## 2019-01-30 NOTE — Patient Instructions (Signed)

## 2019-02-16 ENCOUNTER — Ambulatory Visit (INDEPENDENT_AMBULATORY_CARE_PROVIDER_SITE_OTHER): Payer: BC Managed Care – PPO | Admitting: *Deleted

## 2019-02-16 ENCOUNTER — Other Ambulatory Visit: Payer: Self-pay | Admitting: Family Medicine

## 2019-02-16 ENCOUNTER — Other Ambulatory Visit: Payer: Self-pay

## 2019-02-16 DIAGNOSIS — Z23 Encounter for immunization: Secondary | ICD-10-CM

## 2019-02-16 DIAGNOSIS — Z1231 Encounter for screening mammogram for malignant neoplasm of breast: Secondary | ICD-10-CM

## 2019-02-16 NOTE — Progress Notes (Signed)
Patient her for flu vaccine.  Flu vaccine and patient tolerated well.

## 2019-05-09 ENCOUNTER — Ambulatory Visit
Admission: RE | Admit: 2019-05-09 | Discharge: 2019-05-09 | Disposition: A | Discharge status: Home / Self Care | Payer: BC Managed Care – PPO | Source: Ambulatory Visit | Attending: Family Medicine | Admitting: Family Medicine

## 2019-05-09 ENCOUNTER — Other Ambulatory Visit: Payer: Self-pay

## 2019-05-09 DIAGNOSIS — Z1231 Encounter for screening mammogram for malignant neoplasm of breast: Secondary | ICD-10-CM | POA: Diagnosis not present

## 2019-05-09 DIAGNOSIS — E2839 Other primary ovarian failure: Secondary | ICD-10-CM

## 2019-05-09 DIAGNOSIS — M85851 Other specified disorders of bone density and structure, right thigh: Secondary | ICD-10-CM | POA: Diagnosis not present

## 2019-05-09 DIAGNOSIS — Z780 Asymptomatic menopausal state: Secondary | ICD-10-CM | POA: Diagnosis not present

## 2019-05-10 ENCOUNTER — Other Ambulatory Visit: Payer: Self-pay | Admitting: Family Medicine

## 2019-05-10 DIAGNOSIS — R928 Other abnormal and inconclusive findings on diagnostic imaging of breast: Secondary | ICD-10-CM

## 2019-05-15 ENCOUNTER — Other Ambulatory Visit: Payer: Self-pay

## 2019-05-15 ENCOUNTER — Ambulatory Visit
Admission: RE | Admit: 2019-05-15 | Discharge: 2019-05-15 | Disposition: A | Discharge status: Home / Self Care | Payer: BC Managed Care – PPO | Source: Ambulatory Visit | Attending: Family Medicine | Admitting: Family Medicine

## 2019-05-15 DIAGNOSIS — N6320 Unspecified lump in the left breast, unspecified quadrant: Secondary | ICD-10-CM | POA: Diagnosis not present

## 2019-05-15 DIAGNOSIS — R928 Other abnormal and inconclusive findings on diagnostic imaging of breast: Secondary | ICD-10-CM

## 2019-05-15 DIAGNOSIS — N6310 Unspecified lump in the right breast, unspecified quadrant: Secondary | ICD-10-CM | POA: Diagnosis not present

## 2019-05-15 DIAGNOSIS — R921 Mammographic calcification found on diagnostic imaging of breast: Secondary | ICD-10-CM | POA: Diagnosis not present

## 2019-05-15 DIAGNOSIS — N6489 Other specified disorders of breast: Secondary | ICD-10-CM | POA: Diagnosis not present

## 2019-05-17 DIAGNOSIS — L578 Other skin changes due to chronic exposure to nonionizing radiation: Secondary | ICD-10-CM | POA: Diagnosis not present

## 2019-05-17 DIAGNOSIS — D2272 Melanocytic nevi of left lower limb, including hip: Secondary | ICD-10-CM | POA: Diagnosis not present

## 2019-05-17 DIAGNOSIS — Z808 Family history of malignant neoplasm of other organs or systems: Secondary | ICD-10-CM | POA: Diagnosis not present

## 2019-05-17 DIAGNOSIS — L821 Other seborrheic keratosis: Secondary | ICD-10-CM | POA: Diagnosis not present

## 2019-06-25 ENCOUNTER — Encounter: Payer: Self-pay | Admitting: Family Medicine

## 2019-06-25 DIAGNOSIS — I10 Essential (primary) hypertension: Secondary | ICD-10-CM

## 2019-06-25 MED ORDER — LISINOPRIL-HYDROCHLOROTHIAZIDE 10-12.5 MG PO TABS: 1.0000 | ORAL_TABLET | Freq: Every day | ORAL | 3 refills | Status: DC

## 2019-08-01 ENCOUNTER — Other Ambulatory Visit: Payer: Self-pay

## 2019-08-02 ENCOUNTER — Encounter: Payer: Self-pay | Admitting: Family Medicine

## 2019-08-02 ENCOUNTER — Other Ambulatory Visit: Payer: Self-pay

## 2019-08-02 ENCOUNTER — Ambulatory Visit (INDEPENDENT_AMBULATORY_CARE_PROVIDER_SITE_OTHER): Payer: BC Managed Care – PPO | Admitting: Family Medicine

## 2019-08-02 VITALS — BP 110/70 | HR 70 | Temp 97.6°F | Resp 18 | Ht 67.5 in | Wt 148.2 lb

## 2019-08-02 DIAGNOSIS — J4520 Mild intermittent asthma, uncomplicated: Secondary | ICD-10-CM

## 2019-08-02 DIAGNOSIS — Z780 Asymptomatic menopausal state: Secondary | ICD-10-CM | POA: Insufficient documentation

## 2019-08-02 DIAGNOSIS — I10 Essential (primary) hypertension: Secondary | ICD-10-CM

## 2019-08-02 LAB — COMPREHENSIVE METABOLIC PANEL
ALT: 17 U/L (ref 0–35)
AST: 20 U/L (ref 0–37)
Albumin: 4.5 g/dL (ref 3.5–5.2)
Alkaline Phosphatase: 51 U/L (ref 39–117)
BUN: 10 mg/dL (ref 6–23)
CO2: 30 mEq/L (ref 19–32)
Calcium: 9.6 mg/dL (ref 8.4–10.5)
Chloride: 97 mEq/L (ref 96–112)
Creatinine, Ser: 0.66 mg/dL (ref 0.40–1.20)
GFR: 91.38 mL/min (ref >60.00)
Glucose, Bld: 94 mg/dL (ref 70–99)
Potassium: 3.9 mEq/L (ref 3.5–5.1)
Sodium: 135 mEq/L (ref 135–145)
Total Bilirubin: 0.7 mg/dL (ref 0.2–1.2)
Total Protein: 7.5 g/dL (ref 6.0–8.3)

## 2019-08-02 LAB — LIPID PANEL
Cholesterol: 218 mg/dL — ABNORMAL HIGH (ref 0–200)
HDL: 81.1 mg/dL (ref >39.00)
LDL Cholesterol: 121 mg/dL — ABNORMAL HIGH (ref 0–99)
NonHDL: 136.56
Total CHOL/HDL Ratio: 3
Triglycerides: 76 mg/dL (ref 0.0–149.0)
VLDL: 15.2 mg/dL (ref 0.0–40.0)

## 2019-08-02 MED ORDER — ESTRADIOL 10 MCG VA TABS: ORAL_TABLET | VAGINAL | 1 refills | Status: DC

## 2019-08-02 NOTE — Assessment & Plan Note (Signed)
Will refill hormone but pt will f/u with gyn

## 2019-08-02 NOTE — Patient Instructions (Signed)
DASH Eating Plan DASH stands for "Dietary Approaches to Stop Hypertension." The DASH eating plan is a healthy eating plan that has been shown to reduce high blood pressure (hypertension). It may also reduce your risk for type 2 diabetes, heart disease, and stroke. The DASH eating plan may also help with weight loss. What are tips for following this plan?  General guidelines  Avoid eating more than 2,300 mg (milligrams) of salt (sodium) a day. If you have hypertension, you may need to reduce your sodium intake to 1,500 mg a day.  Limit alcohol intake to no more than 1 drink a day for nonpregnant women and 2 drinks a day for men. One drink equals 12 oz of beer, 5 oz of wine, or 1 oz of hard liquor.  Work with your health care provider to maintain a healthy body weight or to lose weight. Ask what an ideal weight is for you.  Get at least 30 minutes of exercise that causes your heart to beat faster (aerobic exercise) most days of the week. Activities may include walking, swimming, or biking.  Work with your health care provider or diet and nutrition specialist (dietitian) to adjust your eating plan to your individual calorie needs. Reading food labels   Check food labels for the amount of sodium per serving. Choose foods with less than 5 percent of the Daily Value of sodium. Generally, foods with less than 300 mg of sodium per serving fit into this eating plan.  To find whole grains, look for the word "whole" as the first word in the ingredient list. Shopping  Buy products labeled as "low-sodium" or "no salt added."  Buy fresh foods. Avoid canned foods and premade or frozen meals. Cooking  Avoid adding salt when cooking. Use salt-free seasonings or herbs instead of table salt or sea salt. Check with your health care provider or pharmacist before using salt substitutes.  Do not fry foods. Cook foods using healthy methods such as baking, boiling, grilling, and broiling instead.  Cook with  heart-healthy oils, such as olive, canola, soybean, or sunflower oil. Meal planning  Eat a balanced diet that includes: ? 5 or more servings of fruits and vegetables each day. At each meal, try to fill half of your plate with fruits and vegetables. ? Up to 6-8 servings of whole grains each day. ? Less than 6 oz of lean meat, poultry, or fish each day. A 3-oz serving of meat is about the same size as a deck of cards. One egg equals 1 oz. ? 2 servings of low-fat dairy each day. ? A serving of nuts, seeds, or beans 5 times each week. ? Heart-healthy fats. Healthy fats called Omega-3 fatty acids are found in foods such as flaxseeds and coldwater fish, like sardines, salmon, and mackerel.  Limit how much you eat of the following: ? Canned or prepackaged foods. ? Food that is high in trans fat, such as fried foods. ? Food that is high in saturated fat, such as fatty meat. ? Sweets, desserts, sugary drinks, and other foods with added sugar. ? Full-fat dairy products.  Do not salt foods before eating.  Try to eat at least 2 vegetarian meals each week.  Eat more home-cooked food and less restaurant, buffet, and fast food.  When eating at a restaurant, ask that your food be prepared with less salt or no salt, if possible. What foods are recommended? The items listed may not be a complete list. Talk with your dietitian about   what dietary choices are best for you. Grains Whole-grain or whole-wheat bread. Whole-grain or whole-wheat pasta. Brown rice. Oatmeal. Quinoa. Bulgur. Whole-grain and low-sodium cereals. Pita bread. Low-fat, low-sodium crackers. Whole-wheat flour tortillas. Vegetables Fresh or frozen vegetables (raw, steamed, roasted, or grilled). Low-sodium or reduced-sodium tomato and vegetable juice. Low-sodium or reduced-sodium tomato sauce and tomato paste. Low-sodium or reduced-sodium canned vegetables. Fruits All fresh, dried, or frozen fruit. Canned fruit in natural juice (without  added sugar). Meat and other protein foods Skinless chicken or turkey. Ground chicken or turkey. Pork with fat trimmed off. Fish and seafood. Egg whites. Dried beans, peas, or lentils. Unsalted nuts, nut butters, and seeds. Unsalted canned beans. Lean cuts of beef with fat trimmed off. Low-sodium, lean deli meat. Dairy Low-fat (1%) or fat-free (skim) milk. Fat-free, low-fat, or reduced-fat cheeses. Nonfat, low-sodium ricotta or cottage cheese. Low-fat or nonfat yogurt. Low-fat, low-sodium cheese. Fats and oils Soft margarine without trans fats. Vegetable oil. Low-fat, reduced-fat, or light mayonnaise and salad dressings (reduced-sodium). Canola, safflower, olive, soybean, and sunflower oils. Avocado. Seasoning and other foods Herbs. Spices. Seasoning mixes without salt. Unsalted popcorn and pretzels. Fat-free sweets. What foods are not recommended? The items listed may not be a complete list. Talk with your dietitian about what dietary choices are best for you. Grains Baked goods made with fat, such as croissants, muffins, or some breads. Dry pasta or rice meal packs. Vegetables Creamed or fried vegetables. Vegetables in a cheese sauce. Regular canned vegetables (not low-sodium or reduced-sodium). Regular canned tomato sauce and paste (not low-sodium or reduced-sodium). Regular tomato and vegetable juice (not low-sodium or reduced-sodium). Pickles. Olives. Fruits Canned fruit in a light or heavy syrup. Fried fruit. Fruit in cream or butter sauce. Meat and other protein foods Fatty cuts of meat. Ribs. Fried meat. Bacon. Sausage. Bologna and other processed lunch meats. Salami. Fatback. Hotdogs. Bratwurst. Salted nuts and seeds. Canned beans with added salt. Canned or smoked fish. Whole eggs or egg yolks. Chicken or turkey with skin. Dairy Whole or 2% milk, cream, and half-and-half. Whole or full-fat cream cheese. Whole-fat or sweetened yogurt. Full-fat cheese. Nondairy creamers. Whipped toppings.  Processed cheese and cheese spreads. Fats and oils Butter. Stick margarine. Lard. Shortening. Ghee. Bacon fat. Tropical oils, such as coconut, palm kernel, or palm oil. Seasoning and other foods Salted popcorn and pretzels. Onion salt, garlic salt, seasoned salt, table salt, and sea salt. Worcestershire sauce. Tartar sauce. Barbecue sauce. Teriyaki sauce. Soy sauce, including reduced-sodium. Steak sauce. Canned and packaged gravies. Fish sauce. Oyster sauce. Cocktail sauce. Horseradish that you find on the shelf. Ketchup. Mustard. Meat flavorings and tenderizers. Bouillon cubes. Hot sauce and Tabasco sauce. Premade or packaged marinades. Premade or packaged taco seasonings. Relishes. Regular salad dressings. Where to find more information:  National Heart, Lung, and Blood Institute: www.nhlbi.nih.gov  American Heart Association: www.heart.org Summary  The DASH eating plan is a healthy eating plan that has been shown to reduce high blood pressure (hypertension). It may also reduce your risk for type 2 diabetes, heart disease, and stroke.  With the DASH eating plan, you should limit salt (sodium) intake to 2,300 mg a day. If you have hypertension, you may need to reduce your sodium intake to 1,500 mg a day.  When on the DASH eating plan, aim to eat more fresh fruits and vegetables, whole grains, lean proteins, low-fat dairy, and heart-healthy fats.  Work with your health care provider or diet and nutrition specialist (dietitian) to adjust your eating plan to your   individual calorie needs. This information is not intended to replace advice given to you by your health care provider. Make sure you discuss any questions you have with your health care provider. Document Revised: 04/29/2017 Document Reviewed: 05/10/2016 Elsevier Patient Education  2020 [employer]  

## 2019-08-02 NOTE — Assessment & Plan Note (Signed)
Well controlled, no changes to meds. Encouraged heart healthy diet such as the DASH diet and exercise as tolerated.  °

## 2019-08-02 NOTE — Progress Notes (Signed)
Patient ID: Tina Petersen, female    DOB: Jun 23, 1960  Age: 59 y.o. MRN: [id number]    Subjective:  Subjective  HPI Tina Petersen presents for f/u bp , asthma  No complaints  Review of Systems  Constitutional: Negative for appetite change, diaphoresis, fatigue and unexpected weight change.  Eyes: Negative for pain, redness and visual disturbance.  Respiratory: Negative for cough, chest tightness, shortness of breath and wheezing.   Cardiovascular: Negative for chest pain, palpitations and leg swelling.  Endocrine: Negative for cold intolerance, heat intolerance, polydipsia, polyphagia and polyuria.  Genitourinary: Negative for difficulty urinating, dysuria and frequency.  Neurological: Negative for dizziness, light-headedness, numbness and headaches.    History Past Medical History:  Diagnosis Date   Hypertension     She has no past surgical history on file.   Her family history includes Alcohol abuse in her father; Alzheimer's disease (age of onset: 16) in her father; Breast cancer in her paternal aunt; Cancer in her maternal uncle and paternal aunt; Heart attack (age of onset: 71) in her father; Hyperlipidemia in her father; Hypertension in her father.She reports that she quit smoking about 30 years ago. She has quit using smokeless tobacco. She reports current alcohol use of about 10.0 standard drinks of alcohol per week. She reports that she does not use drugs.  Current Outpatient Medications on File Prior to Visit  Medication Sig Dispense Refill   albuterol (PROVENTIL HFA;VENTOLIN HFA) 108 (90 Base) MCG/ACT inhaler Inhale 2 puffs into the lungs every 6 (six) hours as needed for wheezing. 3 Inhaler 1   aspirin 81 MG tablet Take 81 mg by mouth daily.     azelastine (ASTELIN) 0.1 % nasal spray Place 2 sprays into both nostrils at bedtime as needed for rhinitis. Use in each nostril as directed 30 mL 3   b complex vitamins tablet Take 1 tablet by mouth daily.      Cholecalciferol (VITAMIN D3) 2000 units TABS Take 1 tablet by mouth daily.     fexofenadine (ALLEGRA) 60 MG tablet Take 60 mg by mouth 2 (two) times daily as needed.      fluticasone (FLONASE) 50 MCG/ACT nasal spray Place 2 sprays into the nose daily. 48 g 3   Fluticasone-Salmeterol (ADVAIR) 100-50 MCG/DOSE AEPB Inhale 1 puff into the lungs 2 (two) times daily. 180 each 1   Glucosamine-Chondroit-Vit C-Mn (GLUCOSAMINE 1500 COMPLEX PO) Take 1 tablet by mouth daily.     lisinopril-hydrochlorothiazide (ZESTORETIC) 10-12.5 MG tablet Take 1 tablet by mouth daily. 90 tablet 3   Multiple Vitamins-Minerals (MULTIVITAMIN WITH MINERALS) tablet Take 1 tablet by mouth daily.     omeprazole (PRILOSEC) 20 MG capsule TAKE 1 CAPSULE DAILY 90 capsule 2   No current facility-administered medications on file prior to visit.     Objective:  Objective  Physical Exam Vitals and nursing note reviewed.  Constitutional:      Appearance: She is well-developed.  HENT:     Head: Normocephalic and atraumatic.  Eyes:     Conjunctiva/sclera: Conjunctivae normal.  Neck:     Thyroid: No thyromegaly.     Vascular: No carotid bruit or JVD.  Cardiovascular:     Rate and Rhythm: Normal rate and regular rhythm.     Heart sounds: Normal heart sounds. No murmur. Friction rub:    Pulmonary:     Effort: Pulmonary effort is normal. No respiratory distress.     Breath sounds: Normal breath sounds. No wheezing or rales.  Chest:  Chest wall: No tenderness.  Musculoskeletal:     Cervical back: Normal range of motion and neck supple.  Neurological:     Mental Status: She is alert and oriented to person, place, and time.    BP 110/70 (BP Location: Right Arm, Patient Position: Sitting, Cuff Size: Normal)    Pulse 70    Temp 97.6 F (36.4 C) (Temporal)    Resp 18    Ht 5' 7.5" (1.715 m)    Wt 148 lb 3.2 oz (67.2 kg)    LMP 08/12/2010    SpO2 99%    BMI 22.87 kg/m  Wt Readings from Last 3 Encounters:  08/02/19 148  lb 3.2 oz (67.2 kg)  01/30/19 142 lb 3.2 oz (64.5 kg)  06/27/18 149 lb (67.6 kg)     Lab Results  Component Value Date   WBC 5.4 01/30/2019   HGB 14.1 01/30/2019   HCT 42.3 01/30/2019   PLT 329.0 01/30/2019   GLUCOSE 85 01/30/2019   CHOL 208 (H) 01/30/2019   TRIG 104.0 01/30/2019   HDL 84.80 01/30/2019   LDLDIRECT 125.6 08/19/2010   LDLCALC 103 (H) 01/30/2019   ALT 15 01/30/2019   AST 19 01/30/2019   NA 136 01/30/2019   K 4.0 01/30/2019   CL 98 01/30/2019   CREATININE 0.64 01/30/2019   BUN 8 01/30/2019   CO2 28 01/30/2019   TSH 3.36 01/30/2019    US BREAST LTD UNI LEFT INC AXILLA  Result Date: 05/15/2019 CLINICAL DATA:  The patient was called back for a right breast mass, a left breast mass, and left breast calcifications. EXAM: DIGITAL DIAGNOSTIC BILATERAL MAMMOGRAM WITH CAD AND TOMO COMPARISON:  Previous exam(s). ACR Breast Density Category b: There are scattered areas of fibroglandular density. FINDINGS: The right breast mass located laterally and inferiorly persists on additional imaging, only seen on the CC view. The left breast mass located laterally and superiorly persists on additional imaging, only seen on the MLO view. The left breast calcifications have not changed for many years and are benign in appearance. Mammographic images were processed with CAD. IMPRESSION: Fibrocystic changes.  Benign left breast calcifications. RECOMMENDATION: Annual screening mammography. I have discussed the findings and recommendations with the patient. If applicable, a reminder letter will be sent to the patient regarding the next appointment. BI-RADS CATEGORY  2: Benign. Electronically Signed   By: Dorise Bullion III M.D   On: 05/15/2019 15:09   US BREAST LTD UNI RIGHT INC AXILLA  Result Date: 05/15/2019 CLINICAL DATA:  The patient was called back for a right breast mass, a left breast mass, and left breast calcifications. EXAM: DIGITAL DIAGNOSTIC BILATERAL MAMMOGRAM WITH CAD AND TOMO  COMPARISON:  Previous exam(s). ACR Breast Density Category b: There are scattered areas of fibroglandular density. FINDINGS: The right breast mass located laterally and inferiorly persists on additional imaging, only seen on the CC view. The left breast mass located laterally and superiorly persists on additional imaging, only seen on the MLO view. The left breast calcifications have not changed for many years and are benign in appearance. Mammographic images were processed with CAD. IMPRESSION: Fibrocystic changes.  Benign left breast calcifications. RECOMMENDATION: Annual screening mammography. I have discussed the findings and recommendations with the patient. If applicable, a reminder letter will be sent to the patient regarding the next appointment. BI-RADS CATEGORY  2: Benign. Electronically Signed   By: Dorise Bullion III M.D   On: 05/15/2019 15:09   MM DIAG BREAST TOMO BILATERAL  Result Date: 05/15/2019 CLINICAL DATA:  The patient was called back for a right breast mass, a left breast mass, and left breast calcifications. EXAM: DIGITAL DIAGNOSTIC BILATERAL MAMMOGRAM WITH CAD AND TOMO COMPARISON:  Previous exam(s). ACR Breast Density Category b: There are scattered areas of fibroglandular density. FINDINGS: The right breast mass located laterally and inferiorly persists on additional imaging, only seen on the CC view. The left breast mass located laterally and superiorly persists on additional imaging, only seen on the MLO view. The left breast calcifications have not changed for many years and are benign in appearance. Mammographic images were processed with CAD. IMPRESSION: Fibrocystic changes.  Benign left breast calcifications. RECOMMENDATION: Annual screening mammography. I have discussed the findings and recommendations with the patient. If applicable, a reminder letter will be sent to the patient regarding the next appointment. BI-RADS CATEGORY  2: Benign. Electronically Signed   By: Dorise Bullion III M.D   On: 05/15/2019 15:09     Assessment & Plan:  Plan  I have changed Tina Petersen's Yuvafem to Estradiol. I am also having her maintain her fexofenadine, fluticasone, aspirin, Glucosamine-Chondroit-Vit C-Mn (GLUCOSAMINE 1500 COMPLEX PO), multivitamin with minerals, b complex vitamins, Vitamin D3, azelastine, omeprazole, albuterol, Fluticasone-Salmeterol, and lisinopril-hydrochlorothiazide.  Meds ordered this encounter  Medications   Estradiol (YUVAFEM) 10 MCG TABS vaginal tablet    Sig: INSERT 1 TABLET VAGINALLY WEEKLY    Dispense:  12 tablet    Refill:  1    Problem List Items Addressed This Visit      Medium   Essential hypertension - Primary    Well controlled, no changes to meds. Encouraged heart healthy diet such as the DASH diet and exercise as tolerated.        Relevant Orders   Lipid panel   Comprehensive metabolic panel     Unprioritized   Asthma    Stable       Menopause    Will refill hormone but pt will f/u with gyn       Relevant Medications   Estradiol (YUVAFEM) 10 MCG TABS vaginal tablet               Follow-up: Return in about 6 months (around 02/02/2020), or if symptoms worsen or fail to improve, for annual exam, fasting.  Ann Held, DO

## 2019-08-02 NOTE — Assessment & Plan Note (Signed)
Stable

## 2019-10-19 DIAGNOSIS — S59902A Unspecified injury of left elbow, initial encounter: Secondary | ICD-10-CM | POA: Diagnosis not present

## 2019-10-19 DIAGNOSIS — W19XXXA Unspecified fall, initial encounter: Secondary | ICD-10-CM | POA: Diagnosis not present

## 2019-10-19 DIAGNOSIS — S52125A Nondisplaced fracture of head of left radius, initial encounter for closed fracture: Secondary | ICD-10-CM | POA: Diagnosis not present

## 2019-10-23 IMAGING — MG DIGITAL SCREENING BILATERAL MAMMOGRAM WITH TOMO AND CAD
8 series · 9 of 24 positions shown · non-contrast
Comparison: Previous exam(s).

CLINICAL DATA: Screening.

EXAM:
DIGITAL SCREENING BILATERAL MAMMOGRAM WITH TOMO AND CAD

[R CC synth-2D]
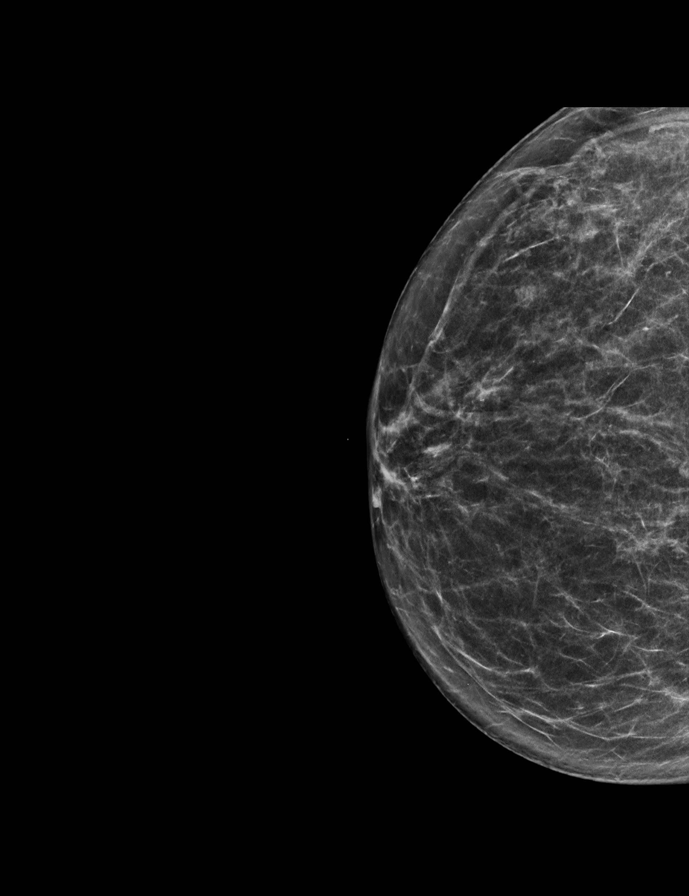

[L MLO synth-2D]
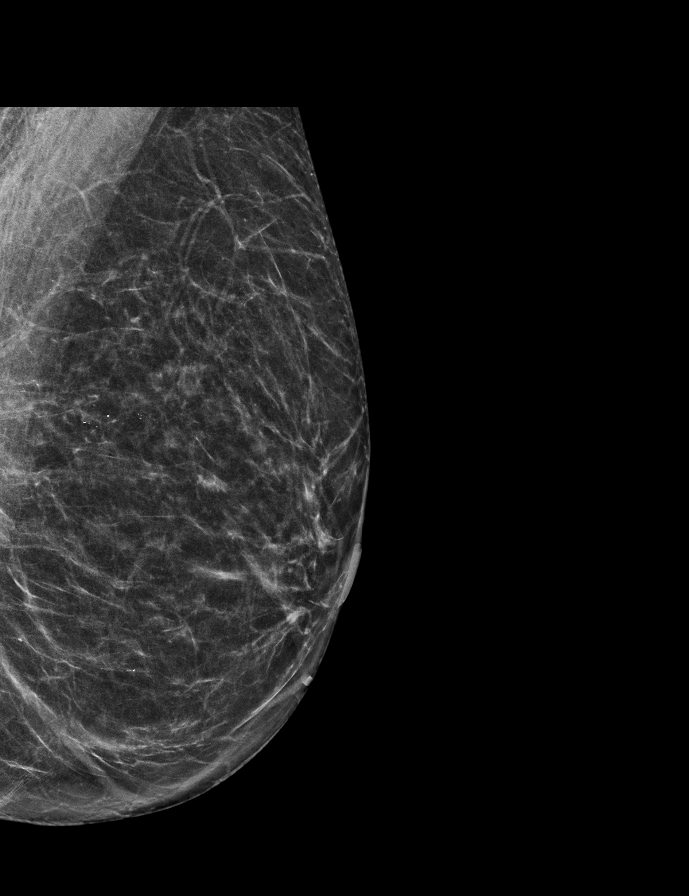

[R MLO synth-2D]
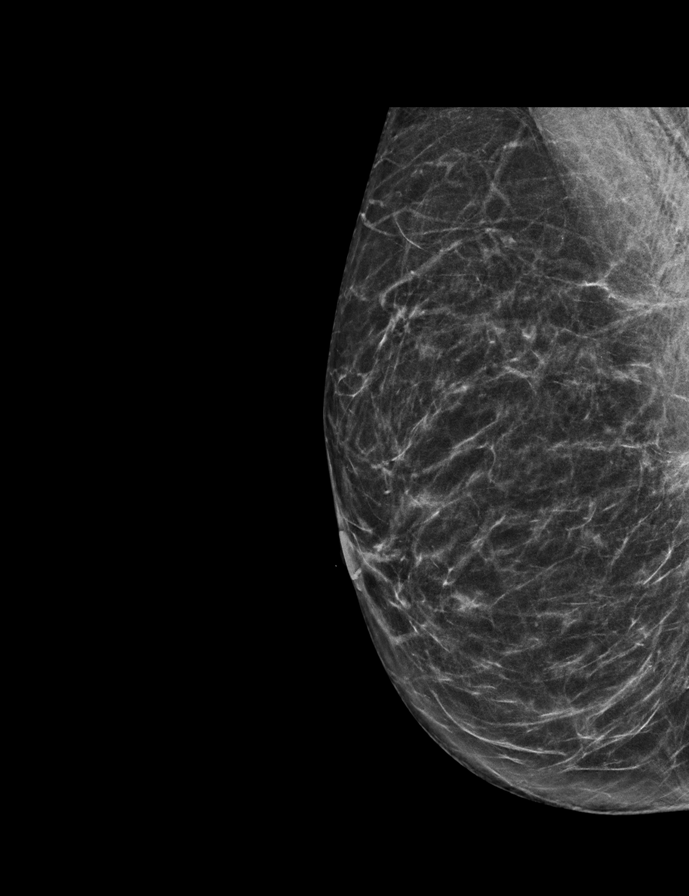

[L CC synth-2D]
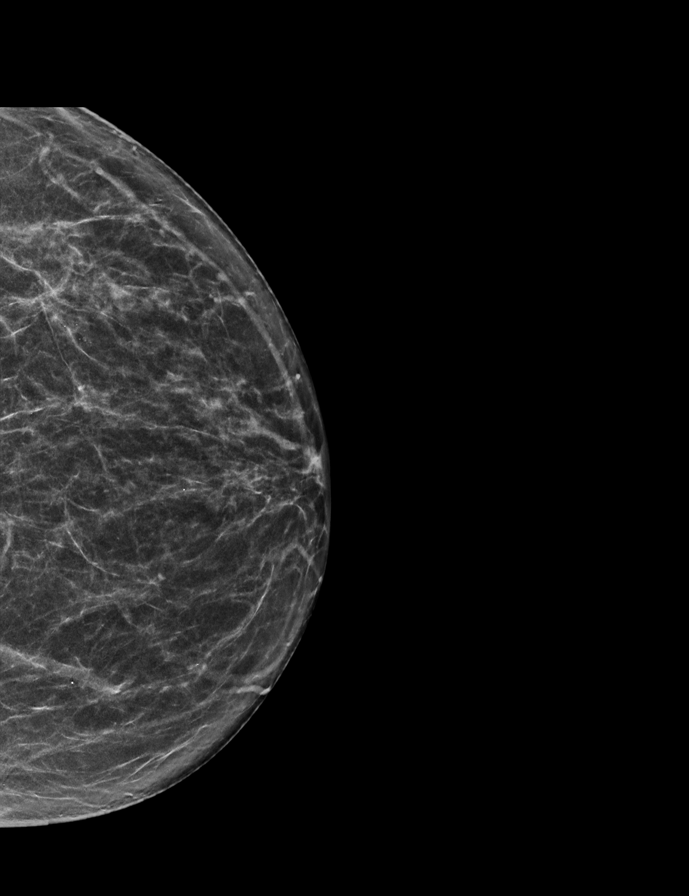

[R MLO tomo · 2 of 61 frames shown]
[frame 20/61]
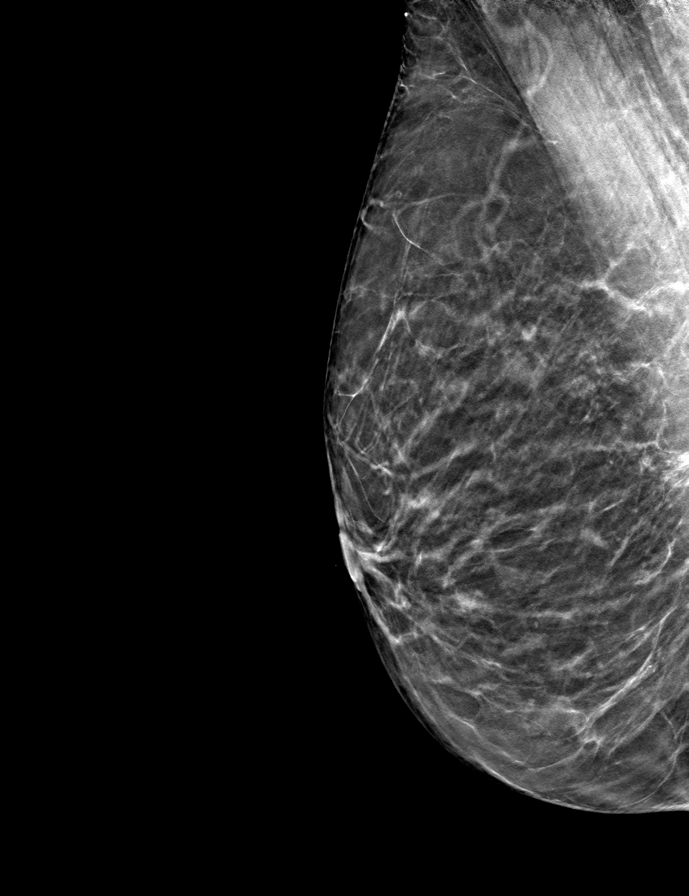
[frame 31/61]
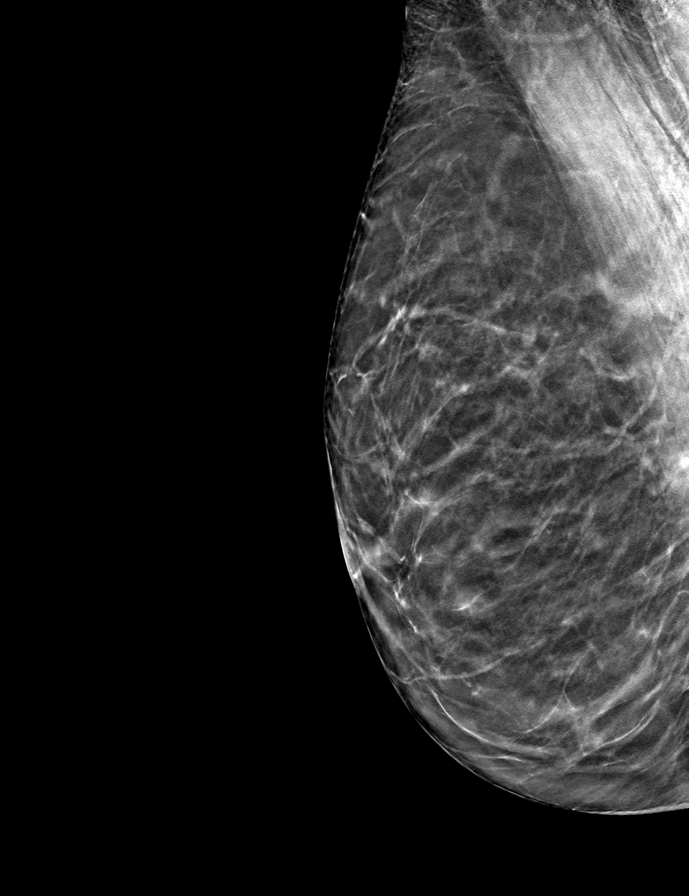

[L CC tomo · tomo slice 32/63.0]
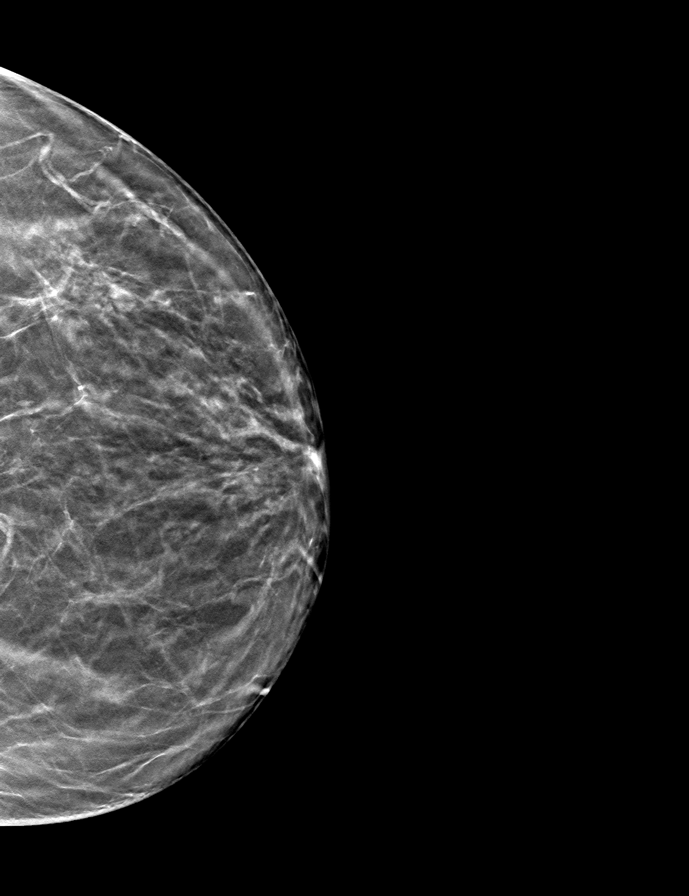

[R CC tomo · tomo slice 31/62.0]
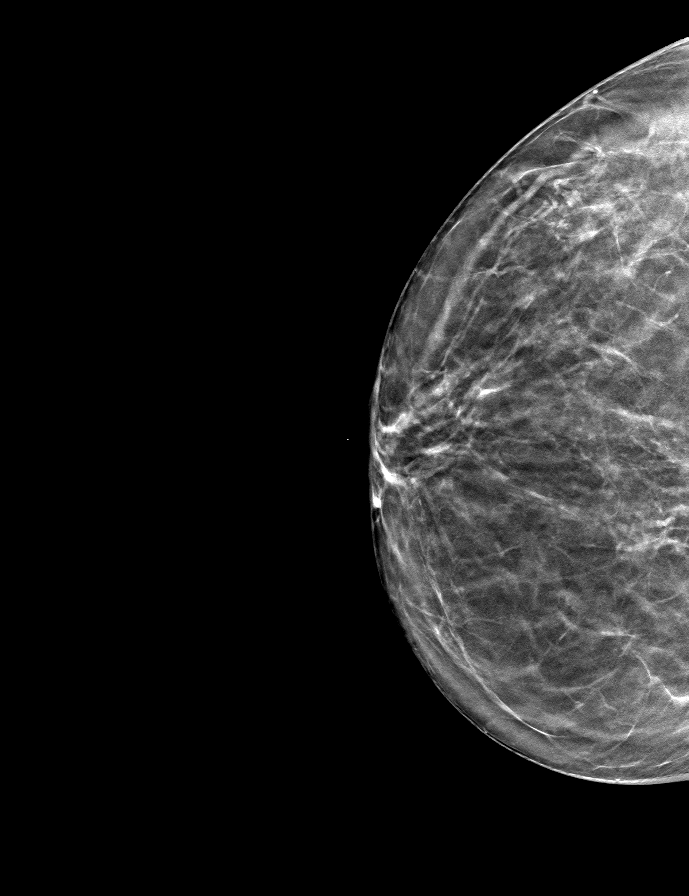

[L MLO tomo · tomo slice 31/60.0]
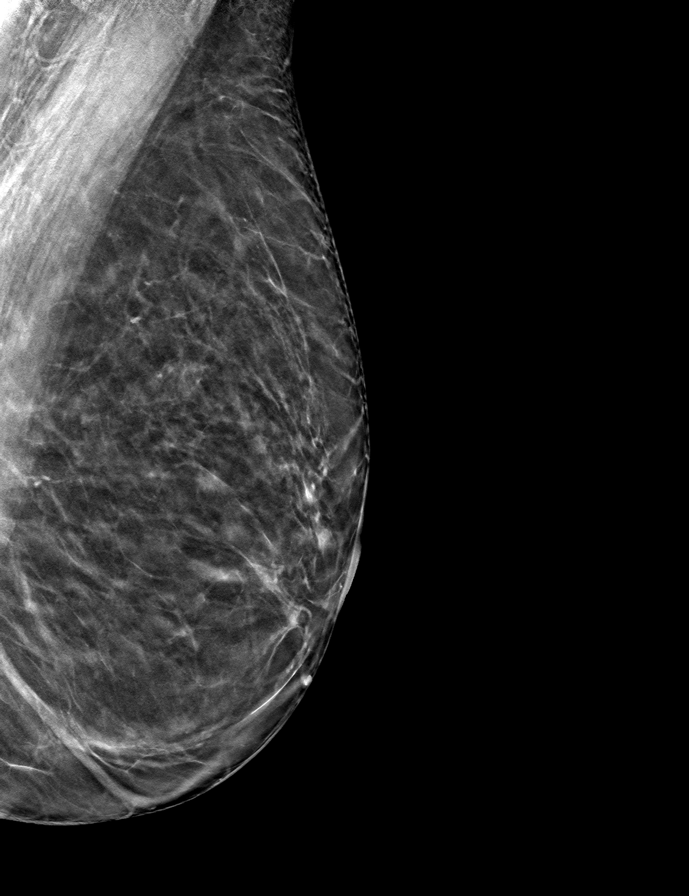

[9 of 24 positions shown; findings below may reference images not displayed]

ACR Breast Density Category b: There are scattered areas of
fibroglandular density.
FINDINGS: There are no findings suspicious for malignancy. Images were
processed with CAD.
IMPRESSION: No mammographic evidence of malignancy. A result letter of this
screening mammogram will be mailed directly to the patient.

RECOMMENDATION:
Screening mammogram in one year. (Code:CN-[id number])

BI-RADS CATEGORY  1: Negative.

## 2019-10-30 ENCOUNTER — Encounter: Payer: Self-pay | Admitting: Family Medicine

## 2019-10-30 NOTE — Telephone Encounter (Signed)
Where did it happen--- we would need to send records to ortho We use a lot of [facility]o but [facility] [place] [facility]

## 2019-10-30 NOTE — Telephone Encounter (Signed)
Please advise 

## 2019-10-31 ENCOUNTER — Other Ambulatory Visit: Payer: Self-pay | Admitting: Family Medicine

## 2019-10-31 DIAGNOSIS — S52125A Nondisplaced fracture of head of left radius, initial encounter for closed fracture: Secondary | ICD-10-CM

## 2019-10-31 NOTE — Telephone Encounter (Signed)
See below. Pt attached reports. Please advise

## 2019-11-05 ENCOUNTER — Encounter: Payer: Self-pay | Admitting: Family Medicine

## 2019-11-05 ENCOUNTER — Other Ambulatory Visit: Payer: Self-pay | Admitting: Family Medicine

## 2019-11-05 DIAGNOSIS — S52125A Nondisplaced fracture of head of left radius, initial encounter for closed fracture: Secondary | ICD-10-CM | POA: Diagnosis not present

## 2019-11-05 DIAGNOSIS — K219 Gastro-esophageal reflux disease without esophagitis: Secondary | ICD-10-CM

## 2019-11-05 MED ORDER — OMEPRAZOLE 20 MG PO CPDR: 20.0000 mg | DELAYED_RELEASE_CAPSULE | Freq: Every day | ORAL | 3 refills | Status: DC

## 2019-11-05 NOTE — Telephone Encounter (Signed)
Sent in rx.

## 2019-12-04 DIAGNOSIS — M25522 Pain in left elbow: Secondary | ICD-10-CM | POA: Diagnosis not present

## 2020-02-05 ENCOUNTER — Encounter: Payer: BC Managed Care – PPO | Admitting: Family Medicine

## 2020-02-25 ENCOUNTER — Encounter: Payer: Self-pay | Admitting: Family Medicine

## 2020-03-06 ENCOUNTER — Encounter: Payer: Self-pay | Admitting: Family Medicine

## 2020-03-06 ENCOUNTER — Ambulatory Visit (INDEPENDENT_AMBULATORY_CARE_PROVIDER_SITE_OTHER): Payer: BC Managed Care – PPO | Admitting: Family Medicine

## 2020-03-06 ENCOUNTER — Other Ambulatory Visit: Payer: Self-pay

## 2020-03-06 VITALS — BP 100/60 | HR 72 | Temp 98.4°F | Resp 12 | Ht 67.5 in | Wt 147.8 lb

## 2020-03-06 DIAGNOSIS — Z0000 Encounter for general adult medical examination without abnormal findings: Secondary | ICD-10-CM

## 2020-03-06 DIAGNOSIS — I10 Essential (primary) hypertension: Secondary | ICD-10-CM

## 2020-03-06 NOTE — Patient Instructions (Signed)

## 2020-03-06 NOTE — Progress Notes (Signed)
Subjective:     Tina Petersen is a 59 y.o. female and is here for a comprehensive physical exam. The patient reports no problems.   Social History   Socioeconomic History  . Marital status: Married    Spouse name: Not on file  . Number of children: 2  . Years of education: Not on file  . Highest education level: Not on file  Occupational History  . Occupation: realtor  Tobacco Use  . Smoking status: Former Smoker    Quit date: 06/09/1989    Years since quitting: 30.7  . Smokeless tobacco: Former Network engineer and Sexual Activity  . Alcohol use: Yes    Alcohol/week: 10.0 standard drinks    Types: 10 Glasses of wine per week  . Drug use: No  . Sexual activity: Yes    Partners: Male  Other Topics Concern  . Not on file  Social History Narrative  . Not on file   Social Determinants of Health   Financial Resource Strain:   . Difficulty of Paying Living Expenses: Not on file  Food Insecurity:   . Worried About Charity fundraiser in the Last Year: Not on file  . Ran Out of Food in the Last Year: Not on file  Transportation Needs:   . Lack of Transportation (Medical): Not on file  . Lack of Transportation (Non-Medical): Not on file  Physical Activity:   . Days of Exercise per Week: Not on file  . Minutes of Exercise per Session: Not on file  Stress:   . Feeling of Stress : Not on file  Social Connections:   . Frequency of Communication with Friends and Family: Not on file  . Frequency of Social Gatherings with Friends and Family: Not on file  . Attends Religious Services: Not on file  . Active Member of Clubs or Organizations: Not on file  . Attends Archivist Meetings: Not on file  . Marital Status: Not on file  Intimate Partner Violence:   . Fear of Current or Ex-Partner: Not on file  . Emotionally Abused: Not on file  . Physically Abused: Not on file  . Sexually Abused: Not on file   Health Maintenance  Topic Date Due  . INFLUENZA VACCINE   12/30/2019  . PAP SMEAR-Modifier  01/19/2020  . MAMMOGRAM  05/14/2020  . TETANUS/TDAP  12/25/2023  . COLONOSCOPY  03/08/2027  . COVID-19 Vaccine  Completed  . Hepatitis C Screening  Completed  . HIV Screening  Completed    The following portions of the patient's history were reviewed and updated as appropriate:  She  has a past medical history of Hypertension. She does not have any pertinent problems on file. She  has no past surgical history on file. Her family history includes Alcohol abuse in her father; Alzheimer's disease (age of onset: 77) in her father; Breast cancer in her paternal aunt; Cancer in her maternal uncle and paternal aunt; Heart attack (age of onset: 28) in her father; Hyperlipidemia in her father; Hypertension in her father. She  reports that she quit smoking about 30 years ago. She has quit using smokeless tobacco. She reports current alcohol use of about 10.0 standard drinks of alcohol per week. She reports that she does not use drugs. She has a current medication list which includes the following prescription(s): albuterol, aspirin, azelastine, b complex vitamins, vitamin d3, estradiol, fexofenadine, fluticasone, fluticasone-salmeterol, glucosamine-chondroit-vit c-mn, lisinopril-hydrochlorothiazide, multivitamin with minerals, and omeprazole. Current Outpatient Medications on File  Prior to Visit  Medication Sig Dispense Refill  . albuterol (PROVENTIL HFA;VENTOLIN HFA) 108 (90 Base) MCG/ACT inhaler Inhale 2 puffs into the lungs every 6 (six) hours as needed for wheezing. 3 Inhaler 1  . aspirin 81 MG tablet Take 81 mg by mouth daily.    Marland Kitchen azelastine (ASTELIN) 0.1 % nasal spray Place 2 sprays into both nostrils at bedtime as needed for rhinitis. Use in each nostril as directed 30 mL 3  . b complex vitamins tablet Take 1 tablet by mouth daily.    . Cholecalciferol (VITAMIN D3) 2000 units TABS Take 1 tablet by mouth daily.    . Estradiol (YUVAFEM) 10 MCG TABS vaginal tablet  INSERT 1 TABLET VAGINALLY WEEKLY 12 tablet 1  . fexofenadine (ALLEGRA) 60 MG tablet Take 60 mg by mouth 2 (two) times daily as needed.     . fluticasone (FLONASE) 50 MCG/ACT nasal spray Place 2 sprays into the nose daily. 48 g 3  . Fluticasone-Salmeterol (ADVAIR) 100-50 MCG/DOSE AEPB Inhale 1 puff into the lungs 2 (two) times daily. 180 each 1  . Glucosamine-Chondroit-Vit C-Mn (GLUCOSAMINE 1500 COMPLEX PO) Take 1 tablet by mouth daily.    Marland Kitchen lisinopril-hydrochlorothiazide (ZESTORETIC) 10-12.5 MG tablet Take 1 tablet by mouth daily. 90 tablet 3  . Multiple Vitamins-Minerals (MULTIVITAMIN WITH MINERALS) tablet Take 1 tablet by mouth daily.    Marland Kitchen omeprazole (PRILOSEC) 20 MG capsule Take 1 capsule (20 mg total) by mouth daily. 90 capsule 3   No current facility-administered medications on file prior to visit.   She is allergic to iohexol and nitrofurantoin..  Review of Systems Review of Systems  Constitutional: Negative for activity change, appetite change and fatigue.  HENT: Negative for hearing loss, congestion, tinnitus and ear discharge.  dentist q102m Eyes: Negative for visual disturbance (see optho q1y -- vision corrected to 20/20 with glasses).  Respiratory: Negative for cough, chest tightness and shortness of breath.   Cardiovascular: Negative for chest pain, palpitations and leg swelling.  Gastrointestinal: Negative for abdominal pain, diarrhea, constipation and abdominal distention.  Genitourinary: Negative for urgency, frequency, decreased urine volume and difficulty urinating.  Musculoskeletal: Negative for back pain, arthralgias and gait problem.  Skin: Negative for color change, pallor and rash.  Neurological: Negative for dizziness, light-headedness, numbness and headaches.  Hematological: Negative for adenopathy. Does not bruise/bleed easily.  Psychiatric/Behavioral: Negative for suicidal ideas, confusion, sleep disturbance, self-injury, dysphoric mood, decreased concentration and  agitation.       Objective:    BP 100/60 (BP Location: Right Arm, Cuff Size: Normal)   Pulse 72   Temp 98.4 F (36.9 C) (Oral)   Resp 12   Ht 5' 7.5" (1.715 m)   Wt 147 lb 12.8 oz (67 kg)   LMP 08/12/2010   SpO2 97%   BMI 22.81 kg/m  General appearance: alert, cooperative, appears stated age and no distress Head: Normocephalic, without obvious abnormality, atraumatic Eyes: negative findings: lids and lashes normal, conjunctivae and sclerae normal and pupils equal, round, reactive to light and accomodation Ears: normal TM's and external ear canals both ears Neck: no adenopathy, no carotid bruit, no JVD, supple, symmetrical, trachea midline and thyroid not enlarged, symmetric, no tenderness/mass/nodules Back: symmetric, no curvature. ROM normal. No CVA tenderness. Lungs: clear to auscultation bilaterally Breasts: gyn Heart: regular rate and rhythm, S1, S2 normal, no murmur, click, rub or gallop Abdomen: soft, non-tender; bowel sounds normal; no masses,  no organomegaly Pelvic: deferred Extremities: extremities normal, atraumatic, no cyanosis or edema Pulses: 2+  and symmetric Skin: Skin color, texture, turgor normal. No rashes or lesions Lymph nodes: Cervical, supraclavicular, and axillary nodes normal. Neurologic: Alert and oriented X 3, normal strength and tone. Normal symmetric reflexes. Normal coordination and gait    Assessment:    Healthy female exam.      Plan:    ghm utd Check labs  See After Visit Summary for Counseling Recommendations    1. Preventative health care See above  - Lipid panel - CBC with Differential/Platelet - TSH - Comprehensive metabolic panel  2. Primary hypertension Well controlled, no changes to meds. Encouraged heart healthy diet such as the DASH diet and exercise as tolerated.   - Lipid panel - CBC with Differential/Platelet - TSH - Comprehensive metabolic panel

## 2020-03-07 LAB — CBC WITH DIFFERENTIAL/PLATELET
Absolute Monocytes: 590 cells/uL (ref 200–950)
Basophils Absolute: 18 cells/uL (ref 0–200)
Basophils Relative: 0.3 %
Eosinophils Absolute: 260 cells/uL (ref 15–500)
Eosinophils Relative: 4.4 %
HCT: 42.9 % (ref 35.0–45.0)
Hemoglobin: 14.6 g/dL (ref 11.7–15.5)
Lymphs Abs: 2236 cells/uL (ref 850–3900)
MCH: 32.4 pg (ref 27.0–33.0)
MCHC: 34 g/dL (ref 32.0–36.0)
MCV: 95.1 fL (ref 80.0–100.0)
MPV: 8.6 fL (ref 7.5–12.5)
Monocytes Relative: 10 %
Neutro Abs: 2797 cells/uL (ref 1500–7800)
Neutrophils Relative %: 47.4 %
Platelets: 343 10*3/uL (ref 140–400)
RBC: 4.51 10*6/uL (ref 3.80–5.10)
RDW: 11.3 % (ref 11.0–15.0)
Total Lymphocyte: 37.9 %
WBC: 5.9 10*3/uL (ref 3.8–10.8)

## 2020-03-07 LAB — LIPID PANEL
Cholesterol: 216 mg/dL — ABNORMAL HIGH (ref <200)
HDL: 79 mg/dL (ref >=50)
LDL Cholesterol (Calc): 115 mg/dL (calc) — ABNORMAL HIGH
Non-HDL Cholesterol (Calc): 137 mg/dL (calc) — ABNORMAL HIGH (ref <130)
Total CHOL/HDL Ratio: 2.7 (calc) (ref <5.0)
Triglycerides: 113 mg/dL (ref <150)

## 2020-03-07 LAB — COMPREHENSIVE METABOLIC PANEL
AG Ratio: 1.7 (calc) (ref 1.0–2.5)
ALT: 13 U/L (ref 6–29)
AST: 21 U/L (ref 10–35)
Albumin: 4.5 g/dL (ref 3.6–5.1)
Alkaline phosphatase (APISO): 52 U/L (ref 37–153)
BUN: 10 mg/dL (ref 7–25)
CO2: 27 mmol/L (ref 20–32)
Calcium: 9.5 mg/dL (ref 8.6–10.4)
Chloride: 98 mmol/L (ref 98–110)
Creat: 0.71 mg/dL (ref 0.50–0.99)
Globulin: 2.7 g/dL (calc) (ref 1.9–3.7)
Glucose, Bld: 104 mg/dL — ABNORMAL HIGH (ref 65–99)
Potassium: 4.7 mmol/L (ref 3.5–5.3)
Sodium: 136 mmol/L (ref 135–146)
Total Bilirubin: 0.5 mg/dL (ref 0.2–1.2)
Total Protein: 7.2 g/dL (ref 6.1–8.1)

## 2020-03-07 LAB — TSH: TSH: 3.22 mIU/L (ref 0.40–4.50)

## 2020-05-02 ENCOUNTER — Other Ambulatory Visit: Payer: Self-pay | Admitting: Family Medicine

## 2020-05-02 DIAGNOSIS — Z1231 Encounter for screening mammogram for malignant neoplasm of breast: Secondary | ICD-10-CM

## 2020-05-27 ENCOUNTER — Other Ambulatory Visit: Payer: Self-pay | Admitting: Family Medicine

## 2020-05-27 DIAGNOSIS — I10 Essential (primary) hypertension: Secondary | ICD-10-CM

## 2020-05-29 ENCOUNTER — Other Ambulatory Visit: Payer: Self-pay | Admitting: *Deleted

## 2020-05-29 DIAGNOSIS — J4520 Mild intermittent asthma, uncomplicated: Secondary | ICD-10-CM

## 2020-05-29 MED ORDER — FLUTICASONE-SALMETEROL 100-50 MCG/DOSE IN AEPB: 1.0000 | INHALATION_SPRAY | Freq: Two times a day (BID) | RESPIRATORY_TRACT | 1 refills | Status: DC

## 2020-06-12 ENCOUNTER — Encounter: Payer: Self-pay | Admitting: Family Medicine

## 2020-06-21 ENCOUNTER — Encounter: Payer: Self-pay | Admitting: Family Medicine

## 2020-06-23 MED ORDER — ALBUTEROL SULFATE HFA 108 (90 BASE) MCG/ACT IN AERS: 2.0000 | INHALATION_SPRAY | Freq: Four times a day (QID) | RESPIRATORY_TRACT | 3 refills | Status: AC | PRN

## 2020-07-08 DIAGNOSIS — D1801 Hemangioma of skin and subcutaneous tissue: Secondary | ICD-10-CM | POA: Diagnosis not present

## 2020-07-08 DIAGNOSIS — L821 Other seborrheic keratosis: Secondary | ICD-10-CM | POA: Diagnosis not present

## 2020-07-08 DIAGNOSIS — D225 Melanocytic nevi of trunk: Secondary | ICD-10-CM | POA: Diagnosis not present

## 2020-07-08 DIAGNOSIS — D2272 Melanocytic nevi of left lower limb, including hip: Secondary | ICD-10-CM | POA: Diagnosis not present

## 2020-07-27 ENCOUNTER — Encounter: Payer: Self-pay | Admitting: Family Medicine

## 2020-07-28 NOTE — Telephone Encounter (Signed)
Pt is scheduled for a f/u on April 11th for hypertension. Please advise

## 2020-08-10 ENCOUNTER — Other Ambulatory Visit: Payer: Self-pay | Admitting: Family Medicine

## 2020-08-10 DIAGNOSIS — Z780 Asymptomatic menopausal state: Secondary | ICD-10-CM

## 2020-09-08 ENCOUNTER — Ambulatory Visit: Payer: BC Managed Care – PPO | Admitting: Family Medicine

## 2020-09-08 ENCOUNTER — Ambulatory Visit (INDEPENDENT_AMBULATORY_CARE_PROVIDER_SITE_OTHER): Payer: BC Managed Care – PPO | Admitting: Family Medicine

## 2020-09-08 ENCOUNTER — Other Ambulatory Visit (HOSPITAL_COMMUNITY)
Admission: RE | Admit: 2020-09-08 | Discharge: 2020-09-08 | Disposition: A | Discharge status: Home / Self Care | Payer: BC Managed Care – PPO | Source: Ambulatory Visit | Attending: Family Medicine | Admitting: Family Medicine

## 2020-09-08 ENCOUNTER — Encounter: Payer: Self-pay | Admitting: Family Medicine

## 2020-09-08 ENCOUNTER — Other Ambulatory Visit: Payer: Self-pay

## 2020-09-08 VITALS — BP 124/78 | HR 64 | Temp 98.3°F | Resp 18 | Ht 67.5 in | Wt 151.6 lb

## 2020-09-08 DIAGNOSIS — Z124 Encounter for screening for malignant neoplasm of cervix: Secondary | ICD-10-CM | POA: Diagnosis not present

## 2020-09-08 DIAGNOSIS — Z01419 Encounter for gynecological examination (general) (routine) without abnormal findings: Secondary | ICD-10-CM | POA: Insufficient documentation

## 2020-09-08 DIAGNOSIS — R739 Hyperglycemia, unspecified: Secondary | ICD-10-CM | POA: Diagnosis not present

## 2020-09-08 DIAGNOSIS — I10 Essential (primary) hypertension: Secondary | ICD-10-CM

## 2020-09-08 LAB — COMPREHENSIVE METABOLIC PANEL
ALT: 23 U/L (ref 0–35)
AST: 22 U/L (ref 0–37)
Albumin: 4.4 g/dL (ref 3.5–5.2)
Alkaline Phosphatase: 50 U/L (ref 39–117)
BUN: 11 mg/dL (ref 6–23)
CO2: 30 mEq/L (ref 19–32)
Calcium: 9.9 mg/dL (ref 8.4–10.5)
Chloride: 99 mEq/L (ref 96–112)
Creatinine, Ser: 0.71 mg/dL (ref 0.40–1.20)
GFR: 92.03 mL/min (ref >60.00)
Glucose, Bld: 86 mg/dL (ref 70–99)
Potassium: 4.2 mEq/L (ref 3.5–5.1)
Sodium: 136 mEq/L (ref 135–145)
Total Bilirubin: 0.8 mg/dL (ref 0.2–1.2)
Total Protein: 7.2 g/dL (ref 6.0–8.3)

## 2020-09-08 LAB — LIPID PANEL
Cholesterol: 217 mg/dL — ABNORMAL HIGH (ref 0–200)
HDL: 85.9 mg/dL (ref >39.00)
LDL Cholesterol: 118 mg/dL — ABNORMAL HIGH (ref 0–99)
NonHDL: 131.13
Total CHOL/HDL Ratio: 3
Triglycerides: 67 mg/dL (ref 0.0–149.0)
VLDL: 13.4 mg/dL (ref 0.0–40.0)

## 2020-09-08 LAB — CBC WITH DIFFERENTIAL/PLATELET
Basophils Absolute: 0 10*3/uL (ref 0.0–0.1)
Basophils Relative: 0.5 % (ref 0.0–3.0)
Eosinophils Absolute: 0.2 10*3/uL (ref 0.0–0.7)
Eosinophils Relative: 4.6 % (ref 0.0–5.0)
HCT: 41.8 % (ref 36.0–46.0)
Hemoglobin: 14 g/dL (ref 12.0–15.0)
Lymphocytes Relative: 46.9 % — ABNORMAL HIGH (ref 12.0–46.0)
Lymphs Abs: 2.2 10*3/uL (ref 0.7–4.0)
MCHC: 33.6 g/dL (ref 30.0–36.0)
MCV: 97.2 fl (ref 78.0–100.0)
Monocytes Absolute: 0.5 10*3/uL (ref 0.1–1.0)
Monocytes Relative: 9.6 % (ref 3.0–12.0)
Neutro Abs: 1.8 10*3/uL (ref 1.4–7.7)
Neutrophils Relative %: 38.4 % — ABNORMAL LOW (ref 43.0–77.0)
Platelets: 319 10*3/uL (ref 150.0–400.0)
RBC: 4.31 Mil/uL (ref 3.87–5.11)
RDW: 13.2 % (ref 11.5–15.5)
WBC: 4.7 10*3/uL (ref 4.0–10.5)

## 2020-09-08 LAB — HEMOGLOBIN A1C: Hgb A1c MFr Bld: 5.6 % (ref 4.6–6.5)

## 2020-09-08 NOTE — Patient Instructions (Signed)

## 2020-09-08 NOTE — Assessment & Plan Note (Signed)
Pap done

## 2020-09-08 NOTE — Assessment & Plan Note (Signed)
Well controlled, no changes to meds. Encouraged heart healthy diet such as the DASH diet and exercise as tolerated.  °

## 2020-09-08 NOTE — Progress Notes (Signed)
Patient ID: Tina Petersen, female    DOB: 23-Jul-1960  Age: 60 y.o. MRN: [id number]    Subjective:  Subjective  HPI Tina Petersen presents for an office visit today. She reports feeling well. She notes that she is managing her BP.  BP Readings from Last 3 Encounters:  09/08/20 124/78  03/06/20 100/60  08/02/19 110/70  She denies any chest pain, SOB, fever, abdominal pain, cough, chills, sore throat, dysuria, urinary incontinence, back pain, HA, or N/V/D at this time.   Review of Systems  Constitutional: Negative for chills, fatigue and fever.  HENT: Negative for ear pain, sinus pressure, sinus pain and sore throat.   Eyes: Negative for pain.  Respiratory: Negative for cough and shortness of breath.   Cardiovascular: Negative for chest pain, palpitations and leg swelling.  Gastrointestinal: Negative for abdominal pain, blood in stool, constipation, diarrhea, nausea and vomiting.  Genitourinary: Negative for dysuria, frequency, hematuria and urgency.  Musculoskeletal: Negative for back pain.  Neurological: Negative for dizziness and headaches.    History Past Medical History:  Diagnosis Date  . Hypertension     She has no past surgical history on file.   Her family history includes Alcohol abuse in her father; Alzheimer's disease (age of onset: 63) in her father; Breast cancer in her paternal aunt; Cancer in her maternal uncle and paternal aunt; Heart attack (age of onset: 45) in her father; Hyperlipidemia in her father; Hypertension in her father.She reports that she quit smoking about 31 years ago. She has quit using smokeless tobacco. She reports current alcohol use of about 10.0 standard drinks of alcohol per week. She reports that she does not use drugs.  Current Outpatient Medications on File Prior to Visit  Medication Sig Dispense Refill  . albuterol (VENTOLIN HFA) 108 (90 Base) MCG/ACT inhaler Inhale 2 puffs into the lungs every 6 (six) hours as needed for wheezing or  shortness of breath. 3 each 3  . aspirin 81 MG tablet Take 81 mg by mouth daily.    Marland Kitchen azelastine (ASTELIN) 0.1 % nasal spray Place 2 sprays into both nostrils at bedtime as needed for rhinitis. Use in each nostril as directed 30 mL 3  . b complex vitamins tablet Take 1 tablet by mouth daily.    . Cholecalciferol (VITAMIN D3) 2000 units TABS Take 1 tablet by mouth daily.    . fexofenadine (ALLEGRA) 60 MG tablet Take 60 mg by mouth 2 (two) times daily as needed.    . fluticasone (FLONASE) 50 MCG/ACT nasal spray Place 2 sprays into the nose daily. 48 g 3  . Fluticasone-Salmeterol (ADVAIR) 100-50 MCG/DOSE AEPB Inhale 1 puff into the lungs 2 (two) times daily. 180 each 1  . Glucosamine-Chondroit-Vit C-Mn (GLUCOSAMINE 1500 COMPLEX PO) Take 1 tablet by mouth daily.    Marland Kitchen lisinopril-hydrochlorothiazide (ZESTORETIC) 10-12.5 MG tablet TAKE 1 TABLET DAILY 90 tablet 3  . Multiple Vitamins-Minerals (MULTIVITAMIN WITH MINERALS) tablet Take 1 tablet by mouth daily.    Marland Kitchen omeprazole (PRILOSEC) 20 MG capsule Take 1 capsule (20 mg total) by mouth daily. 90 capsule 3  . YUVAFEM 10 MCG TABS vaginal tablet INSERT 1 TABLET VAGINALLY WEEKLY 4 tablet 3   No current facility-administered medications on file prior to visit.     Objective:  Objective  Physical Exam Vitals and nursing note reviewed.  Constitutional:      General: She is not in acute distress.    Appearance: Normal appearance. She is well-developed. She is not ill-appearing.  HENT:     Head: Normocephalic and atraumatic.     Right Ear: External ear normal.     Left Ear: External ear normal.     Nose: Nose normal.  Eyes:     Extraocular Movements: Extraocular movements intact.     Pupils: Pupils are equal, round, and reactive to light.  Cardiovascular:     Rate and Rhythm: Normal rate and regular rhythm.     Pulses: Normal pulses.     Heart sounds: Normal heart sounds. No murmur heard. No friction rub. No gallop.   Pulmonary:     Effort:  Pulmonary effort is normal. No respiratory distress.     Breath sounds: Normal breath sounds. No stridor. No wheezing, rhonchi or rales.  Abdominal:     General: Bowel sounds are normal. There is no distension.     Palpations: Abdomen is soft. There is no mass.     Tenderness: There is no abdominal tenderness. There is no guarding.     Hernia: No hernia is present.  Genitourinary:    General: Normal vulva.     Exam position: Lithotomy position.     Pubic Area: No rash or pubic lice.      Labia:        Right: No rash, tenderness, lesion or injury.        Left: No rash, tenderness, lesion or injury.      Vagina: No vaginal discharge.     Cervix: Normal.     Uterus: Normal. Not tender and no uterine prolapse.      Adnexa: Left adnexa normal.       Right: No mass or tenderness.         Left: No mass or tenderness.       Rectum: Guaiac result negative. No mass, tenderness, anal fissure, external hemorrhoid or internal hemorrhoid. Normal anal tone.     Musculoskeletal:        General: Normal range of motion.     Cervical back: Normal range of motion and neck supple.  Skin:    General: Skin is warm and dry.  Neurological:     Mental Status: She is alert and oriented to person, place, and time.  Psychiatric:        Behavior: Behavior normal.        Thought Content: Thought content normal.    BP 124/78 (BP Location: Left Arm, Patient Position: Sitting, Cuff Size: Normal)   Pulse 64   Temp 98.3 F (36.8 C) (Oral)   Resp 18   Ht 5' 7.5" (1.715 m)   Wt 151 lb 9.6 oz (68.8 kg)   LMP 08/12/2010   SpO2 98%   BMI 23.39 kg/m  Wt Readings from Last 3 Encounters:  09/08/20 151 lb 9.6 oz (68.8 kg)  03/06/20 147 lb 12.8 oz (67 kg)  08/02/19 148 lb 3.2 oz (67.2 kg)     Lab Results  Component Value Date   WBC 5.9 03/06/2020   HGB 14.6 03/06/2020   HCT 42.9 03/06/2020   PLT 343 03/06/2020   GLUCOSE 104 (H) 03/06/2020   CHOL 216 (H) 03/06/2020   TRIG 113 03/06/2020   HDL 79  03/06/2020   LDLDIRECT 125.6 08/19/2010   LDLCALC 115 (H) 03/06/2020   ALT 13 03/06/2020   AST 21 03/06/2020   NA 136 03/06/2020   K 4.7 03/06/2020   CL 98 03/06/2020   CREATININE 0.71 03/06/2020   BUN 10 03/06/2020   CO2 27 03/06/2020  TSH 3.22 03/06/2020    US BREAST LTD UNI LEFT INC AXILLA  Result Date: 05/15/2019 CLINICAL DATA:  The patient was called back for a right breast mass, a left breast mass, and left breast calcifications. EXAM: DIGITAL DIAGNOSTIC BILATERAL MAMMOGRAM WITH CAD AND TOMO COMPARISON:  Previous exam(s). ACR Breast Density Category b: There are scattered areas of fibroglandular density. FINDINGS: The right breast mass located laterally and inferiorly persists on additional imaging, only seen on the CC view. The left breast mass located laterally and superiorly persists on additional imaging, only seen on the MLO view. The left breast calcifications have not changed for many years and are benign in appearance. Mammographic images were processed with CAD. IMPRESSION: Fibrocystic changes.  Benign left breast calcifications. RECOMMENDATION: Annual screening mammography. I have discussed the findings and recommendations with the patient. If applicable, a reminder letter will be sent to the patient regarding the next appointment. BI-RADS CATEGORY  2: Benign. Electronically Signed   By: Dorise Bullion III M.D   On: 05/15/2019 15:09   US BREAST LTD UNI RIGHT INC AXILLA  Result Date: 05/15/2019 CLINICAL DATA:  The patient was called back for a right breast mass, a left breast mass, and left breast calcifications. EXAM: DIGITAL DIAGNOSTIC BILATERAL MAMMOGRAM WITH CAD AND TOMO COMPARISON:  Previous exam(s). ACR Breast Density Category b: There are scattered areas of fibroglandular density. FINDINGS: The right breast mass located laterally and inferiorly persists on additional imaging, only seen on the CC view. The left breast mass located laterally and superiorly persists on  additional imaging, only seen on the MLO view. The left breast calcifications have not changed for many years and are benign in appearance. Mammographic images were processed with CAD. IMPRESSION: Fibrocystic changes.  Benign left breast calcifications. RECOMMENDATION: Annual screening mammography. I have discussed the findings and recommendations with the patient. If applicable, a reminder letter will be sent to the patient regarding the next appointment. BI-RADS CATEGORY  2: Benign. Electronically Signed   By: Dorise Bullion III M.D   On: 05/15/2019 15:09   MM DIAG BREAST TOMO BILATERAL  Result Date: 05/15/2019 CLINICAL DATA:  The patient was called back for a right breast mass, a left breast mass, and left breast calcifications. EXAM: DIGITAL DIAGNOSTIC BILATERAL MAMMOGRAM WITH CAD AND TOMO COMPARISON:  Previous exam(s). ACR Breast Density Category b: There are scattered areas of fibroglandular density. FINDINGS: The right breast mass located laterally and inferiorly persists on additional imaging, only seen on the CC view. The left breast mass located laterally and superiorly persists on additional imaging, only seen on the MLO view. The left breast calcifications have not changed for many years and are benign in appearance. Mammographic images were processed with CAD. IMPRESSION: Fibrocystic changes.  Benign left breast calcifications. RECOMMENDATION: Annual screening mammography. I have discussed the findings and recommendations with the patient. If applicable, a reminder letter will be sent to the patient regarding the next appointment. BI-RADS CATEGORY  2: Benign. Electronically Signed   By: Dorise Bullion III M.D   On: 05/15/2019 15:09     Assessment & Plan:  Plan    No orders of the defined types were placed in this encounter.   Problem List Items Addressed This Visit      Medium   Essential hypertension - Primary    Well controlled, no changes to meds. Encouraged heart healthy diet  such as the DASH diet and exercise as tolerated.         Unprioritized  Gynecologic exam normal    Pap done        Relevant Orders   Lipid panel   Comprehensive metabolic panel   Hemoglobin A1c   CBC with Differential/Platelet   Cytology - PAP( [place])    Other Visit Diagnoses    Primary hypertension       Relevant Orders   Lipid panel   Comprehensive metabolic panel   Hemoglobin A1c   CBC with Differential/Platelet   Hyperglycemia       Relevant Orders   Lipid panel   Comprehensive metabolic panel   Hemoglobin A1c   CBC with Differential/Platelet   Screening for cervical cancer       Relevant Orders   Cytology - PAP( [place])      Follow-up: Return in about 6 months (around 03/10/2021) for annual exam, fasting.   I,Gordon Zheng,acting as a Education administrator for [employer], DO.,have documented all relevant documentation on the behalf of Ann Held, DO,as directed by  Ann Held, DO while in the presence of [place], DO, have reviewed all documentation for this visit. The documentation on 09/08/20 for the exam, diagnosis, procedures, and orders are all accurate and complete.

## 2020-09-09 LAB — CYTOLOGY - PAP: Diagnosis: NEGATIVE

## 2020-09-26 ENCOUNTER — Ambulatory Visit
Admission: RE | Admit: 2020-09-26 | Discharge: 2020-09-26 | Disposition: A | Discharge status: Home / Self Care | Payer: BC Managed Care – PPO | Source: Ambulatory Visit | Attending: Family Medicine | Admitting: Family Medicine

## 2020-09-26 ENCOUNTER — Other Ambulatory Visit: Payer: Self-pay

## 2020-09-26 DIAGNOSIS — Z1231 Encounter for screening mammogram for malignant neoplasm of breast: Secondary | ICD-10-CM | POA: Diagnosis not present

## 2020-11-11 ENCOUNTER — Other Ambulatory Visit: Payer: Self-pay | Admitting: Family Medicine

## 2020-11-11 DIAGNOSIS — K219 Gastro-esophageal reflux disease without esophagitis: Secondary | ICD-10-CM

## 2020-11-12 IMAGING — US US BREAST*L* LIMITED INC AXILLA
1 series · 12 of 12 positions shown · non-contrast
Comparison: Previous exam(s).

CLINICAL DATA: The patient was called back for a right breast mass,
a left breast mass, and left breast calcifications.

EXAM:
DIGITAL DIAGNOSTIC BILATERAL MAMMOGRAM WITH CAD AND TOMO

[Series 1: us breast*left* limited inc axilla · 0.05mm/px · 12 of 12 slices shown]
[im 1/12]
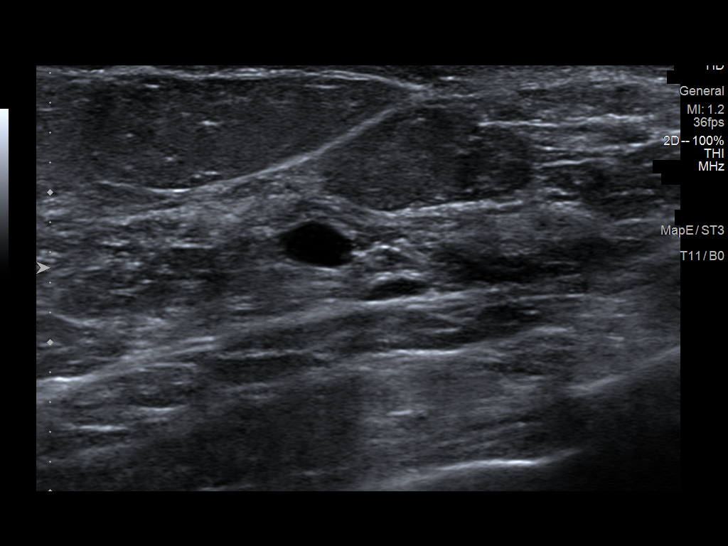
[im 2/12]
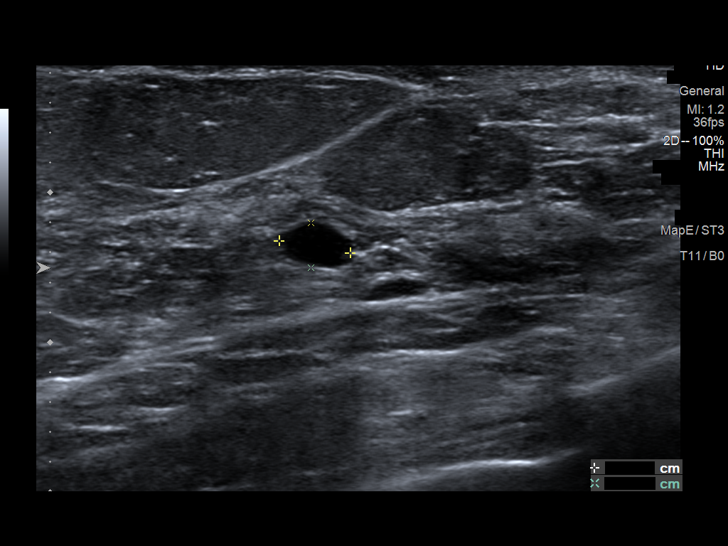
[im 3/12]
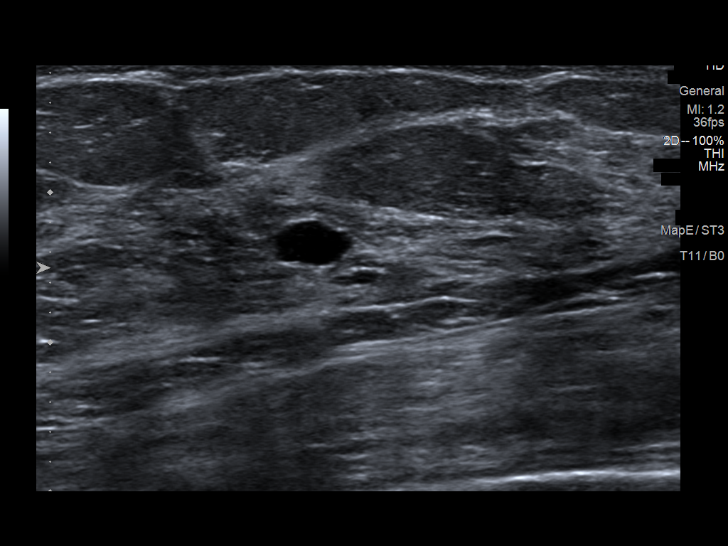
[im 4/12]
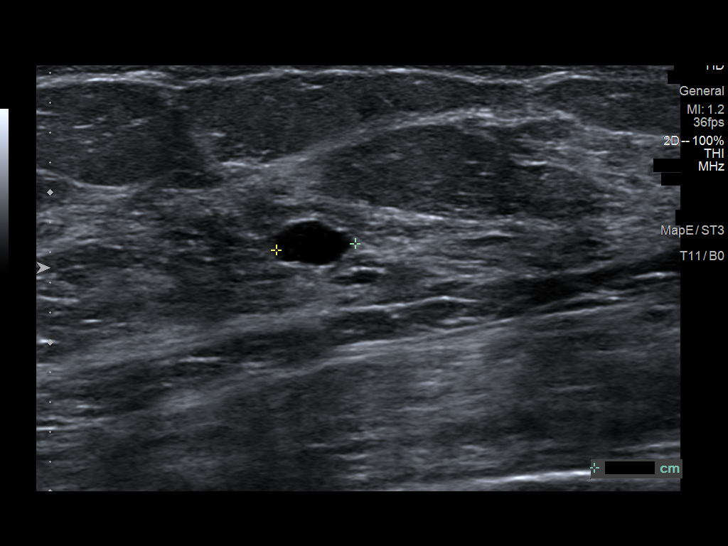
[im 5/12]
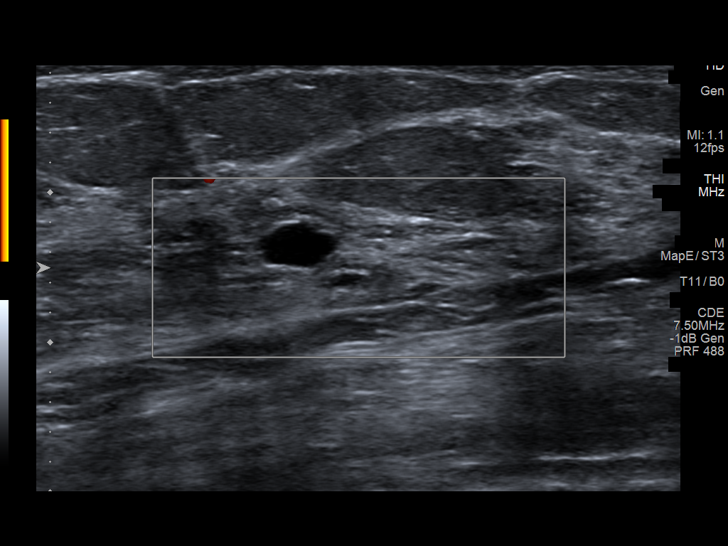
[im 6/12]
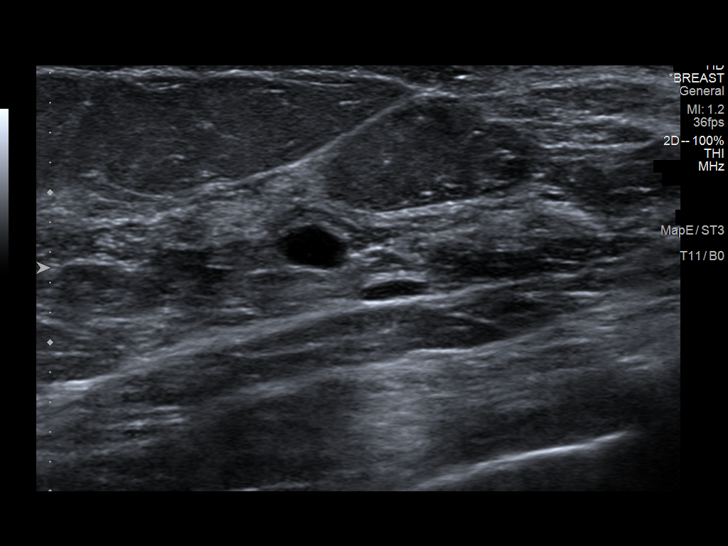
[im 7/12]
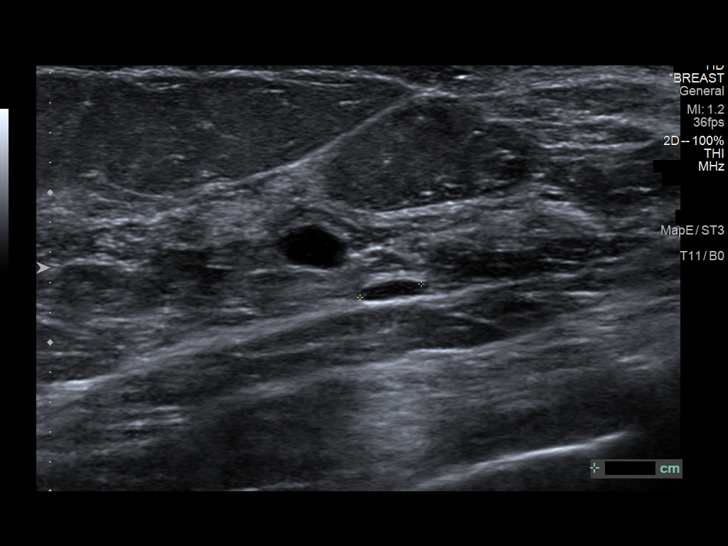
[im 8/12]
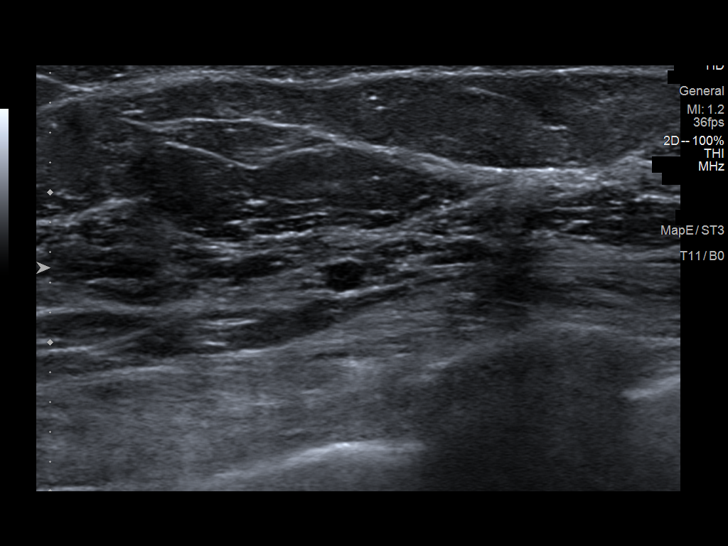
[im 9/12]
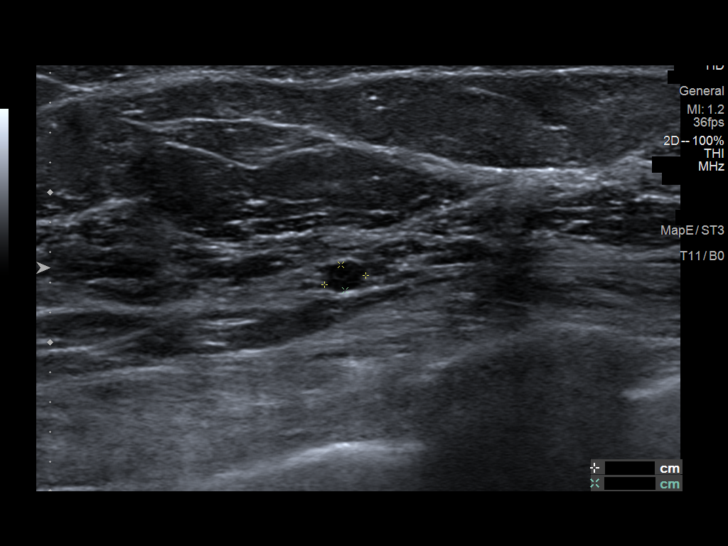
[im 10/12]
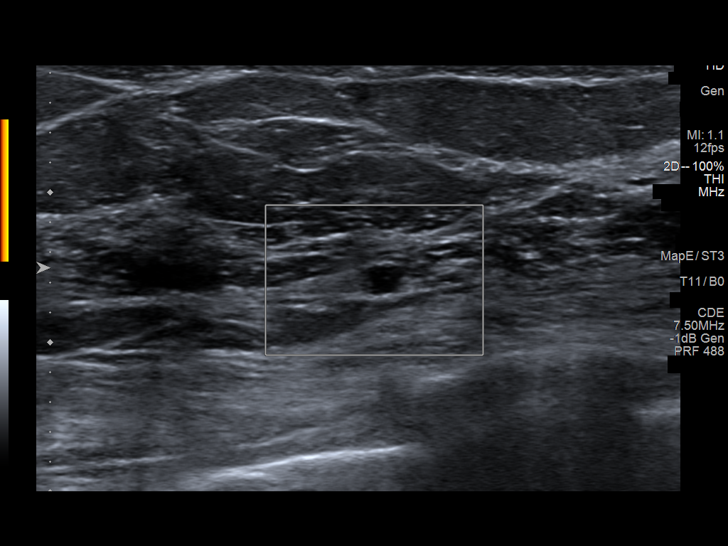
[im 11/12]
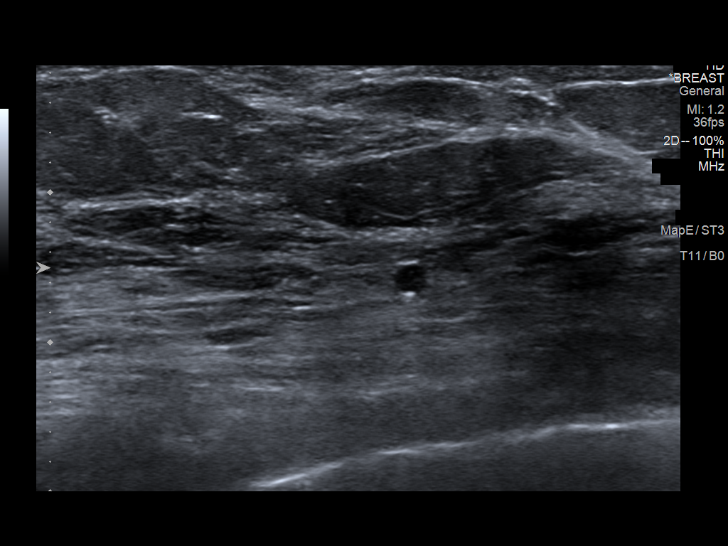
[im 12/12]
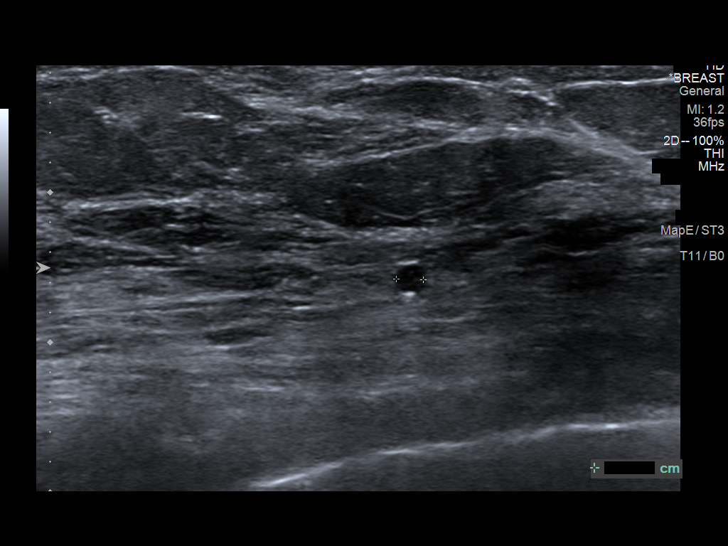

[12 of 12 positions shown; findings below may reference images not displayed]

ACR Breast Density Category b: There are scattered areas of
fibroglandular density.
FINDINGS: The right breast mass located laterally and inferiorly persists on
additional imaging, only seen on the CC view.

The left breast mass located laterally and superiorly persists on
additional imaging, only seen on the MLO view.

The left breast calcifications have not changed for many years and
are benign in appearance.

Mammographic images were processed with CAD.
IMPRESSION: Fibrocystic changes.  Benign left breast calcifications.

RECOMMENDATION:
Annual screening mammography.

I have discussed the findings and recommendations with the patient.
If applicable, a reminder letter will be sent to the patient
regarding the next appointment.

BI-RADS CATEGORY  2: Benign.

## 2020-11-17 ENCOUNTER — Other Ambulatory Visit: Payer: Self-pay | Admitting: Family Medicine

## 2020-11-17 DIAGNOSIS — J4520 Mild intermittent asthma, uncomplicated: Secondary | ICD-10-CM

## 2021-02-22 ENCOUNTER — Encounter: Payer: Self-pay | Admitting: Family Medicine

## 2021-03-09 ENCOUNTER — Other Ambulatory Visit: Payer: Self-pay

## 2021-03-10 ENCOUNTER — Ambulatory Visit (INDEPENDENT_AMBULATORY_CARE_PROVIDER_SITE_OTHER): Payer: BC Managed Care – PPO | Admitting: Family Medicine

## 2021-03-10 ENCOUNTER — Encounter: Payer: Self-pay | Admitting: Family Medicine

## 2021-03-10 VITALS — BP 128/80 | HR 68 | Temp 98.4°F | Resp 18 | Ht 67.5 in | Wt 146.8 lb

## 2021-03-10 DIAGNOSIS — Z23 Encounter for immunization: Secondary | ICD-10-CM

## 2021-03-10 DIAGNOSIS — I10 Essential (primary) hypertension: Secondary | ICD-10-CM | POA: Diagnosis not present

## 2021-03-10 DIAGNOSIS — Z0000 Encounter for general adult medical examination without abnormal findings: Secondary | ICD-10-CM | POA: Diagnosis not present

## 2021-03-10 LAB — CBC WITH DIFFERENTIAL/PLATELET
Basophils Absolute: 0 10*3/uL (ref 0.0–0.1)
Basophils Relative: 0.5 % (ref 0.0–3.0)
Eosinophils Absolute: 0.4 10*3/uL (ref 0.0–0.7)
Eosinophils Relative: 6.5 % — ABNORMAL HIGH (ref 0.0–5.0)
HCT: 41.8 % (ref 36.0–46.0)
Hemoglobin: 13.9 g/dL (ref 12.0–15.0)
Lymphocytes Relative: 36.1 % (ref 12.0–46.0)
Lymphs Abs: 2 10*3/uL (ref 0.7–4.0)
MCHC: 33.2 g/dL (ref 30.0–36.0)
MCV: 96.8 fl (ref 78.0–100.0)
Monocytes Absolute: 0.4 10*3/uL (ref 0.1–1.0)
Monocytes Relative: 7.9 % (ref 3.0–12.0)
Neutro Abs: 2.7 10*3/uL (ref 1.4–7.7)
Neutrophils Relative %: 49 % (ref 43.0–77.0)
Platelets: 327 10*3/uL (ref 150.0–400.0)
RBC: 4.32 Mil/uL (ref 3.87–5.11)
RDW: 13.5 % (ref 11.5–15.5)
WBC: 5.5 10*3/uL (ref 4.0–10.5)

## 2021-03-10 LAB — COMPREHENSIVE METABOLIC PANEL
ALT: 20 U/L (ref 0–35)
AST: 23 U/L (ref 0–37)
Albumin: 4.4 g/dL (ref 3.5–5.2)
Alkaline Phosphatase: 44 U/L (ref 39–117)
BUN: 10 mg/dL (ref 6–23)
CO2: 30 mEq/L (ref 19–32)
Calcium: 9.4 mg/dL (ref 8.4–10.5)
Chloride: 100 mEq/L (ref 96–112)
Creatinine, Ser: 0.69 mg/dL (ref 0.40–1.20)
GFR: 93.6 mL/min (ref >60.00)
Glucose, Bld: 93 mg/dL (ref 70–99)
Potassium: 4.3 mEq/L (ref 3.5–5.1)
Sodium: 138 mEq/L (ref 135–145)
Total Bilirubin: 0.7 mg/dL (ref 0.2–1.2)
Total Protein: 7.2 g/dL (ref 6.0–8.3)

## 2021-03-10 LAB — LIPID PANEL
Cholesterol: 223 mg/dL — ABNORMAL HIGH (ref 0–200)
HDL: 95.6 mg/dL (ref >39.00)
LDL Cholesterol: 113 mg/dL — ABNORMAL HIGH (ref 0–99)
NonHDL: 127.41
Total CHOL/HDL Ratio: 2
Triglycerides: 72 mg/dL (ref 0.0–149.0)
VLDL: 14.4 mg/dL (ref 0.0–40.0)

## 2021-03-10 LAB — TSH: TSH: 2.44 u[IU]/mL (ref 0.35–5.50)

## 2021-03-10 NOTE — Progress Notes (Addendum)
Subjective:   By signing my name below, I, Tina Petersen, attest that this documentation has been prepared under the direction and in the presence of Dr. [name], DO. 03/10/2021     Patient ID: Tina Petersen, female    DOB: 1961-05-30, 60 y.o.   MRN: [id number]  Chief Complaint  Patient presents with   Annual Exam    Pt states fasting.     HPI Patient is in today for a comprehensive physical exam.   She denies having any fever, new moles, congestion, sore throat, muscle pain, joint pain, chest pain, cough, SOB, wheezing, n/v/d, constipation, blood in stool, dysuria, frequency, hematuria, or headaches at this time. She reports her mother recently passed away at age 57 from congestive heart failure. Otherwise she has no other recent changes to her family medical history.  She has no recent surgical procedures completed in the past year.  She drinks around 10 glasses of wine weekly. She does not use drugs. She does not smoke tobacco products. She reports participating in exercising by walking at least 1-2 hours weekly. She is planning on increasing her daily exercise.  She has not seen her GYN specialist recently but has completed her pap smear during her last PCP visit.  She continues seeing her dermatologist specialist at this time.  She is receiving a flu vaccine during this visit. She has 3 pfizer Covid-19 vaccines at this time. She reports getting Covid-19 on NFA,2130. She is interested in getting the bivalent Covid-19 vaccine at a later time.    Past Medical History:  Diagnosis Date   Hypertension     History reviewed. No pertinent surgical history.  Family History  Problem Relation Age of Onset   Heart failure Mother    Heart attack Father 65   Hypertension Father    Hyperlipidemia Father    Alcohol abuse Father    Alzheimer's disease Father 54   Cancer Maternal Uncle        skin,     Cancer Paternal Aunt        breast   Breast cancer Paternal Aunt     Breast cancer Paternal Aunt     Social History   Socioeconomic History   Marital status: Married    Spouse name: Not on file   Number of children: 2   Years of education: Not on file   Highest education level: Not on file  Occupational History   Occupation: realtor  Tobacco Use   Smoking status: Former    Types: Cigarettes    Quit date: 06/09/1989    Years since quitting: 31.7   Smokeless tobacco: Never  Vaping Use   Vaping Use: Never used  Substance and Sexual Activity   Alcohol use: Yes    Alcohol/week: 10.0 standard drinks    Types: 10 Glasses of wine per week   Drug use: No   Sexual activity: Yes    Partners: Male  Other Topics Concern   Not on file  Social History Narrative   Exercise -- off and on-- walking  1 hr 2-3 x a week   Social Determinants of Health   Financial Resource Strain: Not on file  Food Insecurity: Not on file  Transportation Needs: Not on file  Physical Activity: Not on file  Stress: Not on file  Social Connections: Not on file  Intimate Partner Violence: Not on file    Outpatient Medications Prior to Visit  Medication Sig Dispense Refill   albuterol (VENTOLIN  HFA) 108 (90 Base) MCG/ACT inhaler Inhale 2 puffs into the lungs every 6 (six) hours as needed for wheezing or shortness of breath. 3 each 3   aspirin 81 MG tablet Take 81 mg by mouth daily.     azelastine (ASTELIN) 0.1 % nasal spray Place 2 sprays into both nostrils at bedtime as needed for rhinitis. Use in each nostril as directed 30 mL 3   b complex vitamins tablet Take 1 tablet by mouth daily.     Cholecalciferol (VITAMIN D3) 2000 units TABS Take 1 tablet by mouth daily.     fexofenadine (ALLEGRA) 60 MG tablet Take 60 mg by mouth 2 (two) times daily as needed.     fluticasone (FLONASE) 50 MCG/ACT nasal spray Place 2 sprays into the nose daily. 48 g 3   Fluticasone-Salmeterol (ADVAIR) 100-50 MCG/DOSE AEPB Inhale 1 puff into the lungs 2 (two) times daily. 180 each 1    Glucosamine-Chondroit-Vit C-Mn (GLUCOSAMINE 1500 COMPLEX PO) Take 1 tablet by mouth daily.     lisinopril-hydrochlorothiazide (ZESTORETIC) 10-12.5 MG tablet TAKE 1 TABLET DAILY 90 tablet 3   Multiple Vitamins-Minerals (MULTIVITAMIN WITH MINERALS) tablet Take 1 tablet by mouth daily.     omeprazole (PRILOSEC) 20 MG capsule TAKE 1 CAPSULE BY MOUTH EVERY DAY 90 capsule 3   YUVAFEM 10 MCG TABS vaginal tablet INSERT 1 TABLET VAGINALLY WEEKLY 4 tablet 3   No facility-administered medications prior to visit.    Allergies  Allergen Reactions   Iohexol      Desc: pre-med with 50 mg benadryl did ok-mild repsiratory distress'72 with ivp dye    Nitrofurantoin     Review of Systems  Constitutional:  Negative for fever and malaise/fatigue.  HENT:  Negative for congestion and sore throat.   Eyes:  Negative for blurred vision.  Respiratory:  Negative for cough, shortness of breath and wheezing.   Cardiovascular:  Negative for chest pain, palpitations and leg swelling.  Gastrointestinal:  Negative for abdominal pain, blood in stool, constipation, diarrhea, nausea and vomiting.  Genitourinary:  Negative for dysuria, frequency and hematuria.  Musculoskeletal:  Negative for falls, joint pain and myalgias.  Skin:  Negative for rash.       (-)New moles  Neurological:  Negative for dizziness, loss of consciousness and headaches.  Endo/Heme/Allergies:  Negative for environmental allergies.  Psychiatric/Behavioral:  Negative for depression. The patient is not nervous/anxious.       Objective:    Physical Exam Vitals and nursing note reviewed.  Constitutional:      General: She is not in acute distress.    Appearance: Normal appearance. She is well-developed. She is not ill-appearing.  HENT:     Head: Normocephalic and atraumatic.     Right Ear: Tympanic membrane, ear canal and external ear normal.     Left Ear: Tympanic membrane, ear canal and external ear normal.     Nose: Nose normal.  Eyes:      Extraocular Movements: Extraocular movements intact.     Pupils: Pupils are equal, round, and reactive to light.  Neck:     Vascular: No carotid bruit (bilateral).  Cardiovascular:     Rate and Rhythm: Normal rate and regular rhythm.     Heart sounds: Normal heart sounds. No murmur heard.   No gallop.  Pulmonary:     Effort: Pulmonary effort is normal. No respiratory distress.     Breath sounds: Normal breath sounds. No wheezing or rales.  Chest:     Chest wall:  No tenderness.  Abdominal:     General: Bowel sounds are normal. There is no distension.     Palpations: Abdomen is soft.     Tenderness: There is no abdominal tenderness. There is no guarding.  Musculoskeletal:        General: Normal range of motion.     Cervical back: Normal range of motion and neck supple.  Skin:    General: Skin is warm and dry.     Findings: No rash.  Neurological:     General: No focal deficit present.     Mental Status: She is alert and oriented to person, place, and time.  Psychiatric:        Mood and Affect: Mood normal.        Behavior: Behavior normal.        Thought Content: Thought content normal.        Judgment: Judgment normal.    BP 128/80 (BP Location: Left Arm, Patient Position: Sitting, Cuff Size: Normal)   Pulse 68   Temp 98.4 F (36.9 C) (Oral)   Resp 18   Ht 5' 7.5" (1.715 m)   Wt 146 lb 12.8 oz (66.6 kg)   LMP 08/12/2010   SpO2 95%   BMI 22.65 kg/m  Wt Readings from Last 3 Encounters:  03/10/21 146 lb 12.8 oz (66.6 kg)  09/08/20 151 lb 9.6 oz (68.8 kg)  03/06/20 147 lb 12.8 oz (67 kg)    Diabetic Foot Exam - Simple   No data filed    Lab Results  Component Value Date   WBC 4.7 09/08/2020   HGB 14.0 09/08/2020   HCT 41.8 09/08/2020   PLT 319.0 09/08/2020   GLUCOSE 86 09/08/2020   CHOL 217 (H) 09/08/2020   TRIG 67.0 09/08/2020   HDL 85.90 09/08/2020   LDLDIRECT 125.6 08/19/2010   LDLCALC 118 (H) 09/08/2020   ALT 23 09/08/2020   AST 22 09/08/2020   NA  136 09/08/2020   K 4.2 09/08/2020   CL 99 09/08/2020   CREATININE 0.71 09/08/2020   BUN 11 09/08/2020   CO2 30 09/08/2020   TSH 3.22 03/06/2020   HGBA1C 5.6 09/08/2020    Lab Results  Component Value Date   TSH 3.22 03/06/2020   Lab Results  Component Value Date   WBC 4.7 09/08/2020   HGB 14.0 09/08/2020   HCT 41.8 09/08/2020   MCV 97.2 09/08/2020   PLT 319.0 09/08/2020   Lab Results  Component Value Date   NA 136 09/08/2020   K 4.2 09/08/2020   CO2 30 09/08/2020   GLUCOSE 86 09/08/2020   BUN 11 09/08/2020   CREATININE 0.71 09/08/2020   BILITOT 0.8 09/08/2020   ALKPHOS 50 09/08/2020   AST 22 09/08/2020   ALT 23 09/08/2020   PROT 7.2 09/08/2020   ALBUMIN 4.4 09/08/2020   CALCIUM 9.9 09/08/2020   GFR 92.03 09/08/2020   Lab Results  Component Value Date   CHOL 217 (H) 09/08/2020   Lab Results  Component Value Date   HDL 85.90 09/08/2020   Lab Results  Component Value Date   LDLCALC 118 (H) 09/08/2020   Lab Results  Component Value Date   TRIG 67.0 09/08/2020   Lab Results  Component Value Date   CHOLHDL 3 09/08/2020   Lab Results  Component Value Date   HGBA1C 5.6 09/08/2020   Mammogram- Last completed 09/26/2020. Results are normal. Repeat in 1 year.  Dexa- Last completed 05/09/2019. Results show she is  osteopenic. Repeat in 2 years.  Colonoscopy- Results showed perianal skin tags, one 5 mm polyp in the ascending colon which was removed, diverticulosis in the sigmoid colon, ascending colon, and cecum,internal hemorrhoids which are presumed to be the source of rectal bleeding, otherwise results are normal. Repeat in 10 years.  Pap smear- Last completed 09/08/2020. Results are normal. Repeat in 3 years.      Assessment & Plan:   Problem List Items Addressed This Visit       2.   Essential hypertension    Well controlled, no changes to meds. Encouraged heart healthy diet such as the DASH diet and exercise as tolerated.         Unprioritized    Preventative health care - Primary    ghm utd Check labs  See AVS Flu shot today covid booster at least 2 weeks prior to trip       Relevant Orders   TSH   Comprehensive metabolic panel   CBC with Differential/Platelet   Lipid panel   Other Visit Diagnoses     Primary hypertension       Relevant Orders   TSH   Comprehensive metabolic panel   CBC with Differential/Platelet   Lipid panel   Need for influenza vaccination       Relevant Orders   Flu Vaccine QUAD 10mo+IM (Fluarix, Fluzone & Alfiuria Quad PF)        No orders of the defined types were placed in this encounter.   I, Dr. [name], DO, personally preformed the services described in this documentation.  All medical record entries made by the scribe were at my direction and in my presence.  I have reviewed the chart and discharge instructions (if applicable) and agree that the record reflects my personal performance and is accurate and complete. 03/10/2021   I,Tina Petersen,acting as a scribe for Ann Held, DO.,have documented all relevant documentation on the behalf of Ann Held, DO,as directed by  Ann Held, DO while in the presence of Ann Held, DO.   Ann Held, DO

## 2021-03-10 NOTE — Assessment & Plan Note (Signed)
ghm utd Check labs  See AVS Flu shot today covid booster at least 2 weeks prior to trip

## 2021-03-10 NOTE — Assessment & Plan Note (Signed)
Well controlled, no changes to meds. Encouraged heart healthy diet such as the DASH diet and exercise as tolerated.  °

## 2021-03-10 NOTE — Patient Instructions (Signed)
Preventive Care 40-61 Years Old, Female Preventive care refers to lifestyle choices and visits with your health care provider that can promote health and wellness. This includes: A yearly physical exam. This is also called an annual wellness visit. Regular dental and eye exams. Immunizations. Screening for certain conditions. Healthy lifestyle choices, such as: Eating a healthy diet. Getting regular exercise. Not using drugs or products that contain nicotine and tobacco. Limiting alcohol use. What can I expect for my preventive care visit? Physical exam Your health care provider will check your: Height and weight. These may be used to calculate your BMI (body mass index). BMI is a measurement that tells if you are at a healthy weight. Heart rate and blood pressure. Body temperature. Skin for abnormal spots. Counseling Your health care provider may ask you questions about your: Past medical problems. Family's medical history. Alcohol, tobacco, and drug use. Emotional well-being. Home life and relationship well-being. Sexual activity. Diet, exercise, and sleep habits. Work and work environment. Access to firearms. Method of birth control. Menstrual cycle. Pregnancy history. What immunizations do I need? Vaccines are usually given at various ages, according to a schedule. Your health care provider will recommend vaccines for you based on your age, medical history, and lifestyle or other factors, such as travel or where you work. What tests do I need? Blood tests Lipid and cholesterol levels. These may be checked every 5 years, or more often if you are over 50 years old. Hepatitis C test. Hepatitis B test. Screening Lung cancer screening. You may have this screening every year starting at age 55 if you have a 30-pack-year history of smoking and currently smoke or have quit within the past 15 years. Colorectal cancer screening. All adults should have this screening starting at  age 50 and continuing until age 75. Your health care provider may recommend screening at age 45 if you are at increased risk. You will have tests every 1-10 years, depending on your results and the type of screening test. Diabetes screening. This is done by checking your blood sugar (glucose) after you have not eaten for a while (fasting). You may have this done every 1-3 years. Mammogram. This may be done every 1-2 years. Talk with your health care provider about when you should start having regular mammograms. This may depend on whether you have a family history of breast cancer. BRCA-related cancer screening. This may be done if you have a family history of breast, ovarian, tubal, or peritoneal cancers. Pelvic exam and Pap test. This may be done every 3 years starting at age 21. Starting at age 30, this may be done every 5 years if you have a Pap test in combination with an HPV test. Other tests STD (sexually transmitted disease) testing, if you are at risk. Bone density scan. This is done to screen for osteoporosis. You may have this scan if you are at high risk for osteoporosis. Talk with your health care provider about your test results, treatment options, and if necessary, the need for more tests. Follow these instructions at home: Eating and drinking  Eat a diet that includes fresh fruits and vegetables, whole grains, lean protein, and low-fat dairy products. Take vitamin and mineral supplements as recommended by your health care provider. Do not drink alcohol if: Your health care provider tells you not to drink. You are pregnant, may be pregnant, or are planning to become pregnant. If you drink alcohol: Limit how much you have to 0-1 drink a day. Be   aware of how much alcohol is in your drink. In the [place] one drink equals one 12 oz bottle of beer (355 mL), one 5 oz glass of wine (148 mL), or one 1 oz glass of hard liquor (44 mL). Lifestyle Take daily care of your teeth and  gums. Brush your teeth every morning and night with fluoride toothpaste. Floss one time each day. Stay active. Exercise for at least 30 minutes 5 or more days each week. Do not use any products that contain nicotine or tobacco, such as cigarettes, e-cigarettes, and chewing tobacco. If you need help quitting, ask your health care provider. Do not use drugs. If you are sexually active, practice safe sex. Use a condom or other form of protection to prevent STIs (sexually transmitted infections). If you do not wish to become pregnant, use a form of birth control. If you plan to become pregnant, see your health care provider for a prepregnancy visit. If told by your health care provider, take low-dose aspirin daily starting at age 63. Find healthy ways to cope with stress, such as: Meditation, yoga, or listening to music. Journaling. Talking to a trusted person. Spending time with friends and family. Safety Always wear your seat belt while driving or riding in a vehicle. Do not drive: If you have been drinking alcohol. Do not ride with someone who has been drinking. When you are tired or distracted. While texting. Wear a helmet and other protective equipment during sports activities. If you have firearms in your house, make sure you follow all gun safety procedures. What's next? Visit your health care provider once a year for an annual wellness visit. Ask your health care provider how often you should have your eyes and teeth checked. Stay up to date on all vaccines. This information is not intended to replace advice given to you by your health care provider. Make sure you discuss any questions you have with your health care provider. Document Revised: 07/25/2020 Document Reviewed: 01/26/2018 Elsevier Patient Education  2022 [employer].

## 2021-05-11 ENCOUNTER — Other Ambulatory Visit: Payer: Self-pay | Admitting: Family Medicine

## 2021-05-11 DIAGNOSIS — J4520 Mild intermittent asthma, uncomplicated: Secondary | ICD-10-CM

## 2021-05-12 ENCOUNTER — Other Ambulatory Visit: Payer: Self-pay | Admitting: Family Medicine

## 2021-05-12 DIAGNOSIS — I10 Essential (primary) hypertension: Secondary | ICD-10-CM

## 2021-07-09 ENCOUNTER — Encounter: Payer: Self-pay | Admitting: Family Medicine

## 2021-09-15 ENCOUNTER — Ambulatory Visit: Payer: BC Managed Care – PPO | Admitting: Family Medicine

## 2021-09-18 ENCOUNTER — Ambulatory Visit (INDEPENDENT_AMBULATORY_CARE_PROVIDER_SITE_OTHER): Payer: 59 | Admitting: Family Medicine

## 2021-09-18 ENCOUNTER — Encounter: Payer: Self-pay | Admitting: Family Medicine

## 2021-09-18 VITALS — BP 116/78 | HR 76 | Temp 98.2°F | Resp 16 | Ht 67.5 in | Wt 149.4 lb

## 2021-09-18 DIAGNOSIS — I10 Essential (primary) hypertension: Secondary | ICD-10-CM

## 2021-09-18 DIAGNOSIS — R0602 Shortness of breath: Secondary | ICD-10-CM

## 2021-09-18 DIAGNOSIS — R002 Palpitations: Secondary | ICD-10-CM

## 2021-09-18 LAB — COMPREHENSIVE METABOLIC PANEL
ALT: 18 U/L (ref 0–35)
AST: 21 U/L (ref 0–37)
Albumin: 4.4 g/dL (ref 3.5–5.2)
Alkaline Phosphatase: 53 U/L (ref 39–117)
BUN: 11 mg/dL (ref 6–23)
CO2: 27 mEq/L (ref 19–32)
Calcium: 9.1 mg/dL (ref 8.4–10.5)
Chloride: 100 mEq/L (ref 96–112)
Creatinine, Ser: 0.66 mg/dL (ref 0.40–1.20)
GFR: 94.26 mL/min (ref >60.00)
Glucose, Bld: 92 mg/dL (ref 70–99)
Potassium: 4.2 mEq/L (ref 3.5–5.1)
Sodium: 136 mEq/L (ref 135–145)
Total Bilirubin: 0.8 mg/dL (ref 0.2–1.2)
Total Protein: 7.2 g/dL (ref 6.0–8.3)

## 2021-09-18 LAB — CBC WITH DIFFERENTIAL/PLATELET
Basophils Absolute: 0 10*3/uL (ref 0.0–0.1)
Basophils Relative: 0.7 % (ref 0.0–3.0)
Eosinophils Absolute: 0.2 10*3/uL (ref 0.0–0.7)
Eosinophils Relative: 4.5 % (ref 0.0–5.0)
HCT: 41.5 % (ref 36.0–46.0)
Hemoglobin: 13.8 g/dL (ref 12.0–15.0)
Lymphocytes Relative: 34 % (ref 12.0–46.0)
Lymphs Abs: 1.7 10*3/uL (ref 0.7–4.0)
MCHC: 33.4 g/dL (ref 30.0–36.0)
MCV: 99.9 fl (ref 78.0–100.0)
Monocytes Absolute: 0.5 10*3/uL (ref 0.1–1.0)
Monocytes Relative: 10.5 % (ref 3.0–12.0)
Neutro Abs: 2.4 10*3/uL (ref 1.4–7.7)
Neutrophils Relative %: 50.3 % (ref 43.0–77.0)
Platelets: 395 10*3/uL (ref 150.0–400.0)
RBC: 4.16 Mil/uL (ref 3.87–5.11)
RDW: 14 % (ref 11.5–15.5)
WBC: 4.9 10*3/uL (ref 4.0–10.5)

## 2021-09-18 LAB — LIPID PANEL
Cholesterol: 218 mg/dL — ABNORMAL HIGH (ref 0–200)
HDL: 87.5 mg/dL (ref >39.00)
LDL Cholesterol: 117 mg/dL — ABNORMAL HIGH (ref 0–99)
NonHDL: 130.36
Total CHOL/HDL Ratio: 2
Triglycerides: 69 mg/dL (ref 0.0–149.0)
VLDL: 13.8 mg/dL (ref 0.0–40.0)

## 2021-09-18 NOTE — Assessment & Plan Note (Signed)
Check echo 

## 2021-09-18 NOTE — Patient Instructions (Signed)

## 2021-09-18 NOTE — Assessment & Plan Note (Signed)
ekg-- NSR ?

## 2021-09-18 NOTE — Progress Notes (Signed)
? ?Subjective:  ? ?By signing my name below, I, Zite Okoli, attest that this documentation has been prepared under the direction and in the presence of Ann Held, DO. 09/18/2021 ? ? Patient ID: Tina Petersen, female    DOB: 1960/10/18, 61 y.o.   MRN: [id number] ? ?Chief Complaint  ?Patient presents with  ? Hypertension  ? Follow-up  ? ? ?HPI ?Patient is in today for an office visit and 6 month f/u on hypertension. ? ?Her  blood pressure is stable at today's visit. ?BP Readings from Last 3 Encounters:  ?09/18/21 116/78  ?03/10/21 128/80  ?09/08/20 124/78  ?  ?She reports she has been experiencing  palpitations and shortness of breath for some time. She was not sure if it was allergies or because she was in a hot room that day.    She has not noticed it being brought on by anything.  The last time it  happened was in June but she still gets occasional palpitations. She had Covid-19 in January. ? ?Past Medical History:  ?Diagnosis Date  ? Hypertension   ? ? ?No past surgical history on file. ? ?Family History  ?Problem Relation Age of Onset  ? Heart failure Mother   ? Heart attack Father 20  ? Hypertension Father   ? Hyperlipidemia Father   ? Alcohol abuse Father   ? Alzheimer's disease Father 59  ? Cancer Maternal Uncle   ?     skin,    ? Cancer Paternal Aunt   ?     breast  ? Breast cancer Paternal Aunt   ? Breast cancer Paternal Aunt   ? ? ?Social History  ? ?Socioeconomic History  ? Marital status: Married  ?  Spouse name: Not on file  ? Number of children: 2  ? Years of education: Not on file  ? Highest education level: Not on file  ?Occupational History  ? Occupation: realtor  ?Tobacco Use  ? Smoking status: Former  ?  Types: Cigarettes  ?  Quit date: 06/09/1989  ?  Years since quitting: 32.2  ? Smokeless tobacco: Never  ?Vaping Use  ? Vaping Use: Never used  ?Substance and Sexual Activity  ? Alcohol use: Yes  ?  Alcohol/week: 10.0 standard drinks  ?  Types: 10 Glasses of wine per week  ? Drug use: No   ? Sexual activity: Yes  ?  Partners: Male  ?Other Topics Concern  ? Not on file  ?Social History Narrative  ? Exercise -- off and on-- walking  1 hr 2-3 x a week  ? ?Social Determinants of Health  ? ?Financial Resource Strain: Not on file  ?Food Insecurity: Not on file  ?Transportation Needs: Not on file  ?Physical Activity: Not on file  ?Stress: Not on file  ?Social Connections: Not on file  ?Intimate Partner Violence: Not on file  ? ? ?Outpatient Medications Prior to Visit  ?Medication Sig Dispense Refill  ? ADVAIR DISKUS 100-50 MCG/ACT AEPB INHALE 1 PUFF INTO         THE LUNGS TWICE DAILY 1 each 2  ? albuterol (VENTOLIN HFA) 108 (90 Base) MCG/ACT inhaler Inhale 2 puffs into the lungs every 6 (six) hours as needed for wheezing or shortness of breath. 3 each 3  ? aspirin 81 MG tablet Take 81 mg by mouth daily.    ? azelastine (ASTELIN) 0.1 % nasal spray Place 2 sprays into both nostrils at bedtime as needed for rhinitis. Use  in each nostril as directed 30 mL 3  ? b complex vitamins tablet Take 1 tablet by mouth daily.    ? Cholecalciferol (VITAMIN D3) 2000 units TABS Take 1 tablet by mouth daily.    ? fexofenadine (ALLEGRA) 60 MG tablet Take 60 mg by mouth 2 (two) times daily as needed.    ? fluticasone (FLONASE) 50 MCG/ACT nasal spray Place 2 sprays into the nose daily. 48 g 3  ? Glucosamine-Chondroit-Vit C-Mn (GLUCOSAMINE 1500 COMPLEX PO) Take 1 tablet by mouth daily.    ? lisinopril-hydrochlorothiazide (ZESTORETIC) 10-12.5 MG tablet TAKE 1 TABLET DAILY 90 tablet 1  ? Multiple Vitamins-Minerals (MULTIVITAMIN WITH MINERALS) tablet Take 1 tablet by mouth daily.    ? omeprazole (PRILOSEC) 20 MG capsule TAKE 1 CAPSULE BY MOUTH EVERY DAY 90 capsule 3  ? YUVAFEM 10 MCG TABS vaginal tablet INSERT 1 TABLET VAGINALLY WEEKLY 4 tablet 3  ? ?No facility-administered medications prior to visit.  ? ? ?Allergies  ?Allergen Reactions  ? Iohexol   ?   Desc: pre-med with 50 mg benadryl did ok-mild repsiratory distress'72 with ivp  dye ?  ? Nitrofurantoin   ? ? ?Review of Systems  ?Constitutional:  Negative for fever.  ?HENT:  Negative for congestion, ear pain, hearing loss, sinus pain and sore throat.   ?Eyes:  Negative for blurred vision and pain.  ?Respiratory:  Positive for shortness of breath. Negative for cough, sputum production and wheezing.   ?Cardiovascular:  Positive for palpitations. Negative for chest pain.  ?Gastrointestinal:  Negative for blood in stool, constipation, diarrhea, nausea and vomiting.  ?Genitourinary:  Negative for dysuria, frequency, hematuria and urgency.  ?Musculoskeletal:  Negative for back pain, falls and myalgias.  ?Neurological:  Negative for dizziness, sensory change, loss of consciousness, weakness and headaches.  ?Endo/Heme/Allergies:  Negative for environmental allergies. Does not bruise/bleed easily.  ?Psychiatric/Behavioral:  Negative for depression and suicidal ideas. The patient is not nervous/anxious and does not have insomnia.   ? ?   ?Objective:  ?  ?Physical Exam ?Constitutional:   ?   General: She is not in acute distress. ?   Appearance: Normal appearance. She is not ill-appearing.  ?HENT:  ?   Head: Normocephalic and atraumatic.  ?   Right Ear: External ear normal.  ?   Left Ear: External ear normal.  ?Eyes:  ?   Extraocular Movements: Extraocular movements intact.  ?   Pupils: Pupils are equal, round, and reactive to light.  ?Cardiovascular:  ?   Rate and Rhythm: Normal rate and regular rhythm.  ?   Pulses: Normal pulses.  ?   Heart sounds: Normal heart sounds. No murmur heard. ?  No gallop.  ?Pulmonary:  ?   Effort: Pulmonary effort is normal. No respiratory distress.  ?   Breath sounds: Normal breath sounds. No wheezing, rhonchi or rales.  ?Abdominal:  ?   General: Bowel sounds are normal. There is no distension.  ?   Palpations: Abdomen is soft. There is no mass.  ?   Tenderness: There is no abdominal tenderness. There is no guarding or rebound.  ?   Hernia: No hernia is present.   ?Musculoskeletal:  ?   Cervical back: Normal range of motion and neck supple.  ?Lymphadenopathy:  ?   Cervical: No cervical adenopathy.  ?Skin: ?   General: Skin is warm and dry.  ?Neurological:  ?   Mental Status: She is alert and oriented to person, place, and time.  ?Psychiatric:     ?  Behavior: Behavior normal.  ? ? ?BP 116/78 (BP Location: Left Arm, Patient Position: Sitting, Cuff Size: Normal)   Pulse 76   Temp 98.2 ?F (36.8 ?C) (Oral)   Resp 16   Ht 5' 7.5" (1.715 m)   Wt 149 lb 6.4 oz (67.8 kg)   LMP 08/12/2010   SpO2 97%   BMI 23.05 kg/m?  ?Wt Readings from Last 3 Encounters:  ?09/18/21 149 lb 6.4 oz (67.8 kg)  ?03/10/21 146 lb 12.8 oz (66.6 kg)  ?09/08/20 151 lb 9.6 oz (68.8 kg)  ? ? ?Diabetic Foot Exam - Simple   ?No data filed ?  ? ?Lab Results  ?Component Value Date  ? WBC 5.5 03/10/2021  ? HGB 13.9 03/10/2021  ? HCT 41.8 03/10/2021  ? PLT 327.0 03/10/2021  ? GLUCOSE 93 03/10/2021  ? CHOL 223 (H) 03/10/2021  ? TRIG 72.0 03/10/2021  ? HDL 95.60 03/10/2021  ? LDLDIRECT 125.6 08/19/2010  ? LDLCALC 113 (H) 03/10/2021  ? ALT 20 03/10/2021  ? AST 23 03/10/2021  ? NA 138 03/10/2021  ? K 4.3 03/10/2021  ? CL 100 03/10/2021  ? CREATININE 0.69 03/10/2021  ? BUN 10 03/10/2021  ? CO2 30 03/10/2021  ? TSH 2.44 03/10/2021  ? HGBA1C 5.6 09/08/2020  ? ? ?Lab Results  ?Component Value Date  ? TSH 2.44 03/10/2021  ? ?Lab Results  ?Component Value Date  ? WBC 5.5 03/10/2021  ? HGB 13.9 03/10/2021  ? HCT 41.8 03/10/2021  ? MCV 96.8 03/10/2021  ? PLT 327.0 03/10/2021  ? ?Lab Results  ?Component Value Date  ? NA 138 03/10/2021  ? K 4.3 03/10/2021  ? CO2 30 03/10/2021  ? GLUCOSE 93 03/10/2021  ? BUN 10 03/10/2021  ? CREATININE 0.69 03/10/2021  ? BILITOT 0.7 03/10/2021  ? ALKPHOS 44 03/10/2021  ? AST 23 03/10/2021  ? ALT 20 03/10/2021  ? PROT 7.2 03/10/2021  ? ALBUMIN 4.4 03/10/2021  ? CALCIUM 9.4 03/10/2021  ? GFR 93.60 03/10/2021  ? ?Lab Results  ?Component Value Date  ? CHOL 223 (H) 03/10/2021  ? ?Lab Results   ?Component Value Date  ? HDL 95.60 03/10/2021  ? ?Lab Results  ?Component Value Date  ? LDLCALC 113 (H) 03/10/2021  ? ?Lab Results  ?Component Value Date  ? TRIG 72.0 03/10/2021  ? ?Lab Results  ?Component Value Date

## 2021-09-18 NOTE — Assessment & Plan Note (Signed)
Well controlled, no changes to meds. Encouraged heart healthy diet such as the DASH diet and exercise as tolerated.  °

## 2021-09-28 ENCOUNTER — Ambulatory Visit (HOSPITAL_BASED_OUTPATIENT_CLINIC_OR_DEPARTMENT_OTHER)
Admission: RE | Admit: 2021-09-28 | Discharge: 2021-09-28 | Disposition: A | Discharge status: Home / Self Care | Payer: 59 | Source: Ambulatory Visit | Attending: Family Medicine | Admitting: Family Medicine

## 2021-09-28 DIAGNOSIS — R0602 Shortness of breath: Secondary | ICD-10-CM | POA: Diagnosis not present

## 2021-09-28 DIAGNOSIS — I10 Essential (primary) hypertension: Secondary | ICD-10-CM | POA: Diagnosis not present

## 2021-09-28 DIAGNOSIS — R002 Palpitations: Secondary | ICD-10-CM | POA: Diagnosis not present

## 2021-09-28 LAB — ECHOCARDIOGRAM COMPLETE
AR max vel: 2.8 cm2
AV Area VTI: 2.82 cm2
AV Area mean vel: 2.62 cm2
AV Mean grad: 4 mmHg
AV Peak grad: 7.6 mmHg
Ao pk vel: 1.38 m/s
Area-P 1/2: 4.19 cm2
S' Lateral: 2.8 cm

## 2021-09-28 NOTE — Progress Notes (Signed)
?  Echocardiogram ?2D Echocardiogram has been performed. ? ?Tina Petersen ?09/28/2021, 2:48 PM ?

## 2021-09-30 ENCOUNTER — Other Ambulatory Visit: Payer: Self-pay

## 2021-09-30 DIAGNOSIS — R0600 Dyspnea, unspecified: Secondary | ICD-10-CM

## 2021-10-04 ENCOUNTER — Other Ambulatory Visit: Payer: Self-pay | Admitting: Family Medicine

## 2021-10-04 DIAGNOSIS — I10 Essential (primary) hypertension: Secondary | ICD-10-CM

## 2021-10-21 ENCOUNTER — Other Ambulatory Visit: Payer: Self-pay | Admitting: Family Medicine

## 2021-10-21 DIAGNOSIS — Z1231 Encounter for screening mammogram for malignant neoplasm of breast: Secondary | ICD-10-CM

## 2021-10-27 ENCOUNTER — Ambulatory Visit
Admission: RE | Admit: 2021-10-27 | Discharge: 2021-10-27 | Disposition: A | Discharge status: Home / Self Care | Payer: 59 | Source: Ambulatory Visit | Attending: Family Medicine | Admitting: Family Medicine

## 2021-10-27 DIAGNOSIS — Z1231 Encounter for screening mammogram for malignant neoplasm of breast: Secondary | ICD-10-CM

## 2021-10-30 ENCOUNTER — Other Ambulatory Visit: Payer: Self-pay | Admitting: Family Medicine

## 2021-10-30 DIAGNOSIS — R928 Other abnormal and inconclusive findings on diagnostic imaging of breast: Secondary | ICD-10-CM

## 2021-11-03 ENCOUNTER — Ambulatory Visit
Admission: RE | Admit: 2021-11-03 | Discharge: 2021-11-03 | Disposition: A | Discharge status: Home / Self Care | Payer: 59 | Source: Ambulatory Visit | Attending: Family Medicine | Admitting: Family Medicine

## 2021-11-03 DIAGNOSIS — R928 Other abnormal and inconclusive findings on diagnostic imaging of breast: Secondary | ICD-10-CM

## 2021-11-08 ENCOUNTER — Other Ambulatory Visit: Payer: Self-pay | Admitting: Family Medicine

## 2021-11-08 DIAGNOSIS — K219 Gastro-esophageal reflux disease without esophagitis: Secondary | ICD-10-CM

## 2021-11-12 ENCOUNTER — Ambulatory Visit: Payer: 59 | Admitting: Cardiology

## 2021-11-13 ENCOUNTER — Other Ambulatory Visit: Payer: Self-pay | Admitting: Family Medicine

## 2021-11-13 DIAGNOSIS — J4520 Mild intermittent asthma, uncomplicated: Secondary | ICD-10-CM

## 2022-02-26 ENCOUNTER — Other Ambulatory Visit: Payer: Self-pay | Admitting: Family Medicine

## 2022-02-26 DIAGNOSIS — I10 Essential (primary) hypertension: Secondary | ICD-10-CM

## 2022-03-12 ENCOUNTER — Encounter: Payer: Self-pay | Admitting: Family Medicine

## 2022-03-12 ENCOUNTER — Ambulatory Visit (INDEPENDENT_AMBULATORY_CARE_PROVIDER_SITE_OTHER): Payer: 59 | Admitting: Family Medicine

## 2022-03-12 VITALS — BP 100/70 | HR 71 | Temp 98.1°F | Resp 18 | Ht 67.5 in | Wt 148.8 lb

## 2022-03-12 DIAGNOSIS — Z0000 Encounter for general adult medical examination without abnormal findings: Secondary | ICD-10-CM | POA: Diagnosis not present

## 2022-03-12 DIAGNOSIS — Z23 Encounter for immunization: Secondary | ICD-10-CM | POA: Diagnosis not present

## 2022-03-12 DIAGNOSIS — E2839 Other primary ovarian failure: Secondary | ICD-10-CM

## 2022-03-12 DIAGNOSIS — I10 Essential (primary) hypertension: Secondary | ICD-10-CM | POA: Diagnosis not present

## 2022-03-12 LAB — COMPREHENSIVE METABOLIC PANEL
ALT: 16 U/L (ref 0–35)
AST: 19 U/L (ref 0–37)
Albumin: 4.7 g/dL (ref 3.5–5.2)
Alkaline Phosphatase: 52 U/L (ref 39–117)
BUN: 13 mg/dL (ref 6–23)
CO2: 29 mEq/L (ref 19–32)
Calcium: 9.7 mg/dL (ref 8.4–10.5)
Chloride: 96 mEq/L (ref 96–112)
Creatinine, Ser: 0.71 mg/dL (ref 0.40–1.20)
GFR: 91.06 mL/min (ref >60.00)
Glucose, Bld: 89 mg/dL (ref 70–99)
Potassium: 4.3 mEq/L (ref 3.5–5.1)
Sodium: 135 mEq/L (ref 135–145)
Total Bilirubin: 0.6 mg/dL (ref 0.2–1.2)
Total Protein: 7.9 g/dL (ref 6.0–8.3)

## 2022-03-12 LAB — CBC WITH DIFFERENTIAL/PLATELET
Basophils Absolute: 0 10*3/uL (ref 0.0–0.1)
Basophils Relative: 0.7 % (ref 0.0–3.0)
Eosinophils Absolute: 0.2 10*3/uL (ref 0.0–0.7)
Eosinophils Relative: 4 % (ref 0.0–5.0)
HCT: 44.1 % (ref 36.0–46.0)
Hemoglobin: 15 g/dL (ref 12.0–15.0)
Lymphocytes Relative: 39.7 % (ref 12.0–46.0)
Lymphs Abs: 2 10*3/uL (ref 0.7–4.0)
MCHC: 34.1 g/dL (ref 30.0–36.0)
MCV: 96.2 fl (ref 78.0–100.0)
Monocytes Absolute: 0.5 10*3/uL (ref 0.1–1.0)
Monocytes Relative: 8.9 % (ref 3.0–12.0)
Neutro Abs: 2.4 10*3/uL (ref 1.4–7.7)
Neutrophils Relative %: 46.7 % (ref 43.0–77.0)
Platelets: 314 10*3/uL (ref 150.0–400.0)
RBC: 4.59 Mil/uL (ref 3.87–5.11)
RDW: 12.6 % (ref 11.5–15.5)
WBC: 5.1 10*3/uL (ref 4.0–10.5)

## 2022-03-12 LAB — LIPID PANEL
Cholesterol: 229 mg/dL — ABNORMAL HIGH (ref 0–200)
HDL: 92.5 mg/dL (ref >39.00)
LDL Cholesterol: 124 mg/dL — ABNORMAL HIGH (ref 0–99)
NonHDL: 136.77
Total CHOL/HDL Ratio: 2
Triglycerides: 66 mg/dL (ref 0.0–149.0)
VLDL: 13.2 mg/dL (ref 0.0–40.0)

## 2022-03-12 LAB — TSH: TSH: 2.38 u[IU]/mL (ref 0.35–5.50)

## 2022-03-12 NOTE — Assessment & Plan Note (Signed)
ghm utd Check labs see avs

## 2022-03-12 NOTE — Progress Notes (Signed)
Subjective:   By signing my name below, I, Tina Petersen, attest that this documentation has been prepared under the direction and in the presence of Tina Petersen, 03/12/2022.   Patient ID: Tina Petersen, female    DOB: 12-25-1960, 61 y.o.   MRN: [id number]  Chief Complaint  Patient presents with  . Annual Exam    Pt states fasting     HPI Patient is in today for a comprehensive physical exam.  She denies chills, fever, itching, hearing loss, sinus pain, congestion, sore throat, cough and hemoptysis, chest pain, palpitations, wheezing, constipation, diarrhea,blood in stool, nausea and vomiting, dysuria, frequency, myalgias and joint pain, depression, anxiety.   Mole- Patient reports that she found a new mole on left knee and has an upcoming appointment with the dermatologist to have it inspected.   Hypertension- Patients blood pressure is a little low this visit. BP Readings from Last 3 Encounters:  03/12/22 100/70  09/18/21 116/78  03/10/21 128/80   Pulse Readings from Last 3 Encounters:  03/12/22 71  09/18/21 76  03/10/21 68   Allergies- She reports that she is doing well with allergies. Immunizations- She is receiving influenza vaccine this visit. Pap smear last completed on 01/18/2017. Mammogram last completed on 11/03/2021 Dexa last completed on 05/09/2019. Exercise- She does not regularly participate in exercise, reports she is walking less. Family history-There are no changes in family history Social history- Patient reports no new surgeries, does not smoke, and drinks about 2 glasses of wine a day. Vision- She is UTD on vision care. Dental- She is UTD on dental    Past Medical History:  Diagnosis Date  . Hypertension     No past surgical history on file.  Family History  Problem Relation Age of Onset  . Heart failure Mother   . Heart attack Father 38  . Hypertension Father   . Hyperlipidemia Father   . Alcohol abuse Father   . Alzheimer's  disease Father 60  . Cancer Maternal Uncle        skin,    . Cancer Paternal Aunt        breast  . Breast cancer Paternal Aunt   . Breast cancer Paternal Aunt     Social History   Socioeconomic History  . Marital status: Married    Spouse name: Not on file  . Number of children: 2  . Years of education: Not on file  . Highest education level: Not on file  Occupational History  . Occupation: realtor  Tobacco Use  . Smoking status: Former    Types: Cigarettes    Quit date: 06/09/1989    Years since quitting: 32.7  . Smokeless tobacco: Never  Vaping Use  . Vaping Use: Never used  Substance and Sexual Activity  . Alcohol use: Yes    Alcohol/week: 10.0 standard drinks of alcohol    Types: 10 Glasses of wine per week  . Drug use: No  . Sexual activity: Yes    Partners: Male  Other Topics Concern  . Not on file  Social History Narrative   Exercise -- off and on-- walking  1 hr 2-3 x a week   Social Determinants of Health   Financial Resource Strain: Not on file  Food Insecurity: Not on file  Transportation Needs: Not on file  Physical Activity: Not on file  Stress: Not on file  Social Connections: Not on file  Intimate Partner Violence: Not on file  Outpatient Medications Prior to Visit  Medication Sig Dispense Refill  . albuterol (VENTOLIN HFA) 108 (90 Base) MCG/ACT inhaler Inhale 2 puffs into the lungs every 6 (six) hours as needed for wheezing or shortness of breath. 3 each 3  . aspirin 81 MG tablet Take 81 mg by mouth daily.    Marland Kitchen azelastine (ASTELIN) 0.1 % nasal spray Place 2 sprays into both nostrils at bedtime as needed for rhinitis. Use in each nostril as directed 30 mL 3  . b complex vitamins tablet Take 1 tablet by mouth daily.    . Cholecalciferol (VITAMIN D3) 2000 units TABS Take 1 tablet by mouth daily.    . fexofenadine (ALLEGRA) 60 MG tablet Take 60 mg by mouth 2 (two) times daily as needed.    . fluticasone (FLONASE) 50 MCG/ACT nasal spray Place 2  sprays into the nose daily. 48 g 3  . fluticasone-salmeterol (ADVAIR DISKUS) 100-50 MCG/ACT AEPB Inhale 1 puff into the lungs 2 (two) times daily. 180 each 1  . Glucosamine-Chondroit-Vit C-Mn (GLUCOSAMINE 1500 COMPLEX PO) Take 1 tablet by mouth daily.    Marland Kitchen lisinopril-hydrochlorothiazide (ZESTORETIC) 10-12.5 MG tablet Take 1 tablet by mouth daily. 90 tablet 0  . Multiple Vitamins-Minerals (MULTIVITAMIN WITH MINERALS) tablet Take 1 tablet by mouth daily.    Marland Kitchen omeprazole (PRILOSEC) 20 MG capsule TAKE 1 CAPSULE BY MOUTH EVERY DAY 90 capsule 3  . YUVAFEM 10 MCG TABS vaginal tablet INSERT 1 TABLET VAGINALLY WEEKLY 4 tablet 3   No facility-administered medications prior to visit.    Allergies  Allergen Reactions  . Iohexol      Desc: pre-med with 50 mg benadryl did ok-mild repsiratory distress'72 with ivp dye   . Nitrofurantoin     Review of Systems  Constitutional:  Negative for chills, fever and malaise/fatigue.  HENT:  Negative for congestion, sinus pain and sore throat.   Eyes:  Negative for blurred vision.  Respiratory:  Negative for cough, hemoptysis, shortness of breath and wheezing.   Cardiovascular:  Negative for chest pain, palpitations and leg swelling.  Gastrointestinal:  Negative for abdominal pain, blood in stool, constipation, diarrhea, nausea and vomiting.  Genitourinary:  Negative for dysuria and frequency.  Musculoskeletal:  Negative for falls, joint pain and myalgias.  Skin:  Negative for itching and rash.       (+) new mole-left knee  Neurological:  Negative for dizziness, loss of consciousness and headaches.  Endo/Heme/Allergies:  Negative for environmental allergies.  Psychiatric/Behavioral:  Negative for depression. The patient is not nervous/anxious.        Objective:    Physical Exam Vitals and nursing note reviewed.  Constitutional:      General: She is not in acute distress.    Appearance: Normal appearance. She is well-developed. She is not  ill-appearing.  HENT:     Head: Normocephalic and atraumatic.     Right Ear: Tympanic membrane, ear canal and external ear normal.     Left Ear: Tympanic membrane, ear canal and external ear normal.     Nose: Nose normal.  Eyes:     Extraocular Movements: Extraocular movements intact.     Pupils: Pupils are equal, round, and reactive to light.  Cardiovascular:     Rate and Rhythm: Normal rate and regular rhythm.     Heart sounds: Normal heart sounds. No murmur heard.    No gallop.  Pulmonary:     Effort: Pulmonary effort is normal. No respiratory distress.     Breath sounds:  Normal breath sounds. No wheezing or rales.  Chest:     Chest wall: No tenderness.  Abdominal:     General: Bowel sounds are normal. There is no distension.     Palpations: Abdomen is soft.     Tenderness: There is no abdominal tenderness. There is no guarding.  Musculoskeletal:        General: No swelling, tenderness, deformity or signs of injury. Normal range of motion.     Cervical back: Normal range of motion and neck supple.     Right lower leg: No edema.     Left lower leg: No edema.  Skin:    General: Skin is warm and dry.     Findings: No rash.  Neurological:     General: No focal deficit present.     Mental Status: She is alert and oriented to person, place, and time.  Psychiatric:        Mood and Affect: Mood normal.        Behavior: Behavior normal.        Thought Content: Thought content normal.        Judgment: Judgment normal.    BP 100/70 (BP Location: Left Arm, Patient Position: Sitting, Cuff Size: Normal)   Pulse 71   Temp 98.1 F (36.7 C) (Oral)   Resp 18   Ht 5' 7.5" (1.715 m)   Wt 148 lb 12.8 oz (67.5 kg)   LMP 08/12/2010   SpO2 98%   BMI 22.96 kg/m  Wt Readings from Last 3 Encounters:  03/12/22 148 lb 12.8 oz (67.5 kg)  09/18/21 149 lb 6.4 oz (67.8 kg)  03/10/21 146 lb 12.8 oz (66.6 kg)    Diabetic Foot Exam - Simple   No data filed    Lab Results  Component Value  Date   WBC 4.9 09/18/2021   HGB 13.8 09/18/2021   HCT 41.5 09/18/2021   PLT 395.0 09/18/2021   GLUCOSE 92 09/18/2021   CHOL 218 (H) 09/18/2021   TRIG 69.0 09/18/2021   HDL 87.50 09/18/2021   LDLDIRECT 125.6 08/19/2010   LDLCALC 117 (H) 09/18/2021   ALT 18 09/18/2021   AST 21 09/18/2021   NA 136 09/18/2021   K 4.2 09/18/2021   CL 100 09/18/2021   CREATININE 0.66 09/18/2021   BUN 11 09/18/2021   CO2 27 09/18/2021   TSH 2.44 03/10/2021   HGBA1C 5.6 09/08/2020    Lab Results  Component Value Date   TSH 2.44 03/10/2021   Lab Results  Component Value Date   WBC 4.9 09/18/2021   HGB 13.8 09/18/2021   HCT 41.5 09/18/2021   MCV 99.9 09/18/2021   PLT 395.0 09/18/2021   Lab Results  Component Value Date   NA 136 09/18/2021   K 4.2 09/18/2021   CO2 27 09/18/2021   GLUCOSE 92 09/18/2021   BUN 11 09/18/2021   CREATININE 0.66 09/18/2021   BILITOT 0.8 09/18/2021   ALKPHOS 53 09/18/2021   AST 21 09/18/2021   ALT 18 09/18/2021   PROT 7.2 09/18/2021   ALBUMIN 4.4 09/18/2021   CALCIUM 9.1 09/18/2021   GFR 94.26 09/18/2021   Lab Results  Component Value Date   CHOL 218 (H) 09/18/2021   Lab Results  Component Value Date   HDL 87.50 09/18/2021   Lab Results  Component Value Date   LDLCALC 117 (H) 09/18/2021   Lab Results  Component Value Date   TRIG 69.0 09/18/2021   Lab Results  Component Value  Date   CHOLHDL 2 09/18/2021   Lab Results  Component Value Date   HGBA1C 5.6 09/08/2020       Assessment & Plan:   Problem List Items Addressed This Visit   None    No orders of the defined types were placed in this encounter.   I, Tina Petersen, personally preformed the services described in this documentation.  All medical record entries made by the scribe were at my direction and in my presence.  I have reviewed the chart and discharge instructions (if applicable) and agree that the record reflects my personal performance and is accurate and complete.  03/12/2022.  I,Verona Buck,acting as a Education administrator for [employer], DO.,have documented all relevant documentation on the behalf of Ann Held, DO,as directed by  Ann Held, DO while in the presence of Ann Held, DO.    Luna Glasgow

## 2022-03-12 NOTE — Assessment & Plan Note (Signed)
Well controlled, no changes to meds. Encouraged heart healthy diet such as the DASH diet and exercise as tolerated.  °

## 2022-03-12 NOTE — Patient Instructions (Signed)
Preventive Care 40-62 Years Old, Female Preventive care refers to lifestyle choices and visits with your health care provider that can promote health and wellness. Preventive care visits are also called wellness exams. What can I expect for my preventive care visit? Counseling Your health care provider may ask you questions about your: Medical history, including: Past medical problems. Family medical history. Pregnancy history. Current health, including: Menstrual cycle. Method of birth control. Emotional well-being. Home life and relationship well-being. Sexual activity and sexual health. Lifestyle, including: Alcohol, nicotine or tobacco, and drug use. Access to firearms. Diet, exercise, and sleep habits. Work and work environment. Sunscreen use. Safety issues such as seatbelt and bike helmet use. Physical exam Your health care provider will check your: Height and weight. These may be used to calculate your BMI (body mass index). BMI is a measurement that tells if you are at a healthy weight. Waist circumference. This measures the distance around your waistline. This measurement also tells if you are at a healthy weight and may help predict your risk of certain diseases, such as type 2 diabetes and high blood pressure. Heart rate and blood pressure. Body temperature. Skin for abnormal spots. What immunizations do I need?  Vaccines are usually given at various ages, according to a schedule. Your health care provider will recommend vaccines for you based on your age, medical history, and lifestyle or other factors, such as travel or where you work. What tests do I need? Screening Your health care provider may recommend screening tests for certain conditions. This may include: Lipid and cholesterol levels. Diabetes screening. This is done by checking your blood sugar (glucose) after you have not eaten for a while (fasting). Pelvic exam and Pap test. Hepatitis B test. Hepatitis C  test. HIV (human immunodeficiency virus) test. STI (sexually transmitted infection) testing, if you are at risk. Lung cancer screening. Colorectal cancer screening. Mammogram. Talk with your health care provider about when you should start having regular mammograms. This may depend on whether you have a family history of breast cancer. BRCA-related cancer screening. This may be done if you have a family history of breast, ovarian, tubal, or peritoneal cancers. Bone density scan. This is done to screen for osteoporosis. Talk with your health care provider about your test results, treatment options, and if necessary, the need for more tests. Follow these instructions at home: Eating and drinking  Eat a diet that includes fresh fruits and vegetables, whole grains, lean protein, and low-fat dairy products. Take vitamin and mineral supplements as recommended by your health care provider. Do not drink alcohol if: Your health care provider tells you not to drink. You are pregnant, may be pregnant, or are planning to become pregnant. If you drink alcohol: Limit how much you have to 0-1 drink a day. Know how much alcohol is in your drink. In the [place] one drink equals one 12 oz bottle of beer (355 mL), one 5 oz glass of wine (148 mL), or one 1 oz glass of hard liquor (44 mL). Lifestyle Brush your teeth every morning and night with fluoride toothpaste. Floss one time each day. Exercise for at least 30 minutes 5 or more days each week. Do not use any products that contain nicotine or tobacco. These products include cigarettes, chewing tobacco, and vaping devices, such as e-cigarettes. If you need help quitting, ask your health care provider. Do not use drugs. If you are sexually active, practice safe sex. Use a condom or other form of protection to   prevent STIs. If you do not wish to become pregnant, use a form of birth control. If you plan to become pregnant, see your health care provider for a  prepregnancy visit. Take aspirin only as told by your health care provider. Make sure that you understand how much to take and what form to take. Work with your health care provider to find out whether it is safe and beneficial for you to take aspirin daily. Find healthy ways to manage stress, such as: Meditation, yoga, or listening to music. Journaling. Talking to a trusted person. Spending time with friends and family. Minimize exposure to UV radiation to reduce your risk of skin cancer. Safety Always wear your seat belt while driving or riding in a vehicle. Do not drive: If you have been drinking alcohol. Do not ride with someone who has been drinking. When you are tired or distracted. While texting. If you have been using any mind-altering substances or drugs. Wear a helmet and other protective equipment during sports activities. If you have firearms in your house, make sure you follow all gun safety procedures. Seek help if you have been physically or sexually abused. What's next? Visit your health care provider once a year for an annual wellness visit. Ask your health care provider how often you should have your eyes and teeth checked. Stay up to date on all vaccines. This information is not intended to replace advice given to you by your health care provider. Make sure you discuss any questions you have with your health care provider. Document Revised: 11/12/2020 Document Reviewed: 11/12/2020 Elsevier Patient Education  [place].

## 2022-03-30 ENCOUNTER — Other Ambulatory Visit: Payer: Self-pay | Admitting: Family Medicine

## 2022-03-30 DIAGNOSIS — Z1231 Encounter for screening mammogram for malignant neoplasm of breast: Secondary | ICD-10-CM

## 2022-05-12 ENCOUNTER — Other Ambulatory Visit: Payer: Self-pay | Admitting: Family Medicine

## 2022-05-12 DIAGNOSIS — I10 Essential (primary) hypertension: Secondary | ICD-10-CM

## 2022-08-12 ENCOUNTER — Other Ambulatory Visit: Payer: Self-pay | Admitting: Family Medicine

## 2022-08-12 DIAGNOSIS — J4520 Mild intermittent asthma, uncomplicated: Secondary | ICD-10-CM

## 2022-09-10 ENCOUNTER — Encounter: Payer: Self-pay | Admitting: Family Medicine

## 2022-09-10 ENCOUNTER — Ambulatory Visit (INDEPENDENT_AMBULATORY_CARE_PROVIDER_SITE_OTHER): Payer: 59 | Admitting: Family Medicine

## 2022-09-10 VITALS — BP 118/82 | HR 72 | Temp 98.2°F | Resp 18 | Ht 67.5 in | Wt 149.6 lb

## 2022-09-10 DIAGNOSIS — I10 Essential (primary) hypertension: Secondary | ICD-10-CM | POA: Diagnosis not present

## 2022-09-10 LAB — COMPREHENSIVE METABOLIC PANEL
ALT: 17 U/L (ref 0–35)
AST: 21 U/L (ref 0–37)
Albumin: 4.7 g/dL (ref 3.5–5.2)
Alkaline Phosphatase: 58 U/L (ref 39–117)
BUN: 10 mg/dL (ref 6–23)
CO2: 28 mEq/L (ref 19–32)
Calcium: 10 mg/dL (ref 8.4–10.5)
Chloride: 97 mEq/L (ref 96–112)
Creatinine, Ser: 0.76 mg/dL (ref 0.40–1.20)
GFR: 83.63 mL/min (ref >60.00)
Glucose, Bld: 96 mg/dL (ref 70–99)
Potassium: 4.3 mEq/L (ref 3.5–5.1)
Sodium: 135 mEq/L (ref 135–145)
Total Bilirubin: 0.8 mg/dL (ref 0.2–1.2)
Total Protein: 7.8 g/dL (ref 6.0–8.3)

## 2022-09-10 LAB — CBC WITH DIFFERENTIAL/PLATELET
Basophils Absolute: 0 K/uL (ref 0.0–0.1)
Basophils Relative: 0.4 % (ref 0.0–3.0)
Eosinophils Absolute: 0.3 K/uL (ref 0.0–0.7)
Eosinophils Relative: 5.9 % — ABNORMAL HIGH (ref 0.0–5.0)
HCT: 44.1 % (ref 36.0–46.0)
Hemoglobin: 14.9 g/dL (ref 12.0–15.0)
Lymphocytes Relative: 45.1 % (ref 12.0–46.0)
Lymphs Abs: 2.4 K/uL (ref 0.7–4.0)
MCHC: 33.8 g/dL (ref 30.0–36.0)
MCV: 97.9 fl (ref 78.0–100.0)
Monocytes Absolute: 0.5 K/uL (ref 0.1–1.0)
Monocytes Relative: 9.4 % (ref 3.0–12.0)
Neutro Abs: 2 K/uL (ref 1.4–7.7)
Neutrophils Relative %: 39.2 % — ABNORMAL LOW (ref 43.0–77.0)
Platelets: 291 K/uL (ref 150.0–400.0)
RBC: 4.5 Mil/uL (ref 3.87–5.11)
RDW: 13.8 % (ref 11.5–15.5)
WBC: 5.2 K/uL (ref 4.0–10.5)

## 2022-09-10 LAB — LIPID PANEL
Cholesterol: 226 mg/dL — ABNORMAL HIGH (ref 0–200)
HDL: 94.2 mg/dL (ref >39.00)
LDL Cholesterol: 117 mg/dL — ABNORMAL HIGH (ref 0–99)
NonHDL: 131.33
Total CHOL/HDL Ratio: 2
Triglycerides: 70 mg/dL (ref 0.0–149.0)
VLDL: 14 mg/dL (ref 0.0–40.0)

## 2022-09-10 NOTE — Progress Notes (Signed)
Established Patient Office Visit  Subjective   Patient ID: Tina Petersen, female    DOB: 03/07/1961  Age: 62 y.o. MRN: [id number]  Chief Complaint  Patient presents with   Hypertension   Follow-up    HPI Pt is here to bp bp.  No complaints  Patient Active Problem List   Diagnosis Date Noted   Essential hypertension 02/17/2009    Priority: Medium    Palpitations 09/18/2021   SOB (shortness of breath) 09/18/2021   Preventative health care 03/10/2021   Gynecologic exam normal 09/08/2020   Menopause 08/02/2019   Colon polyp 05/17/2017   ALLERGIC RHINITIS, SEASONAL 08/05/2009   Asthma 02/17/2009   GERD 02/17/2009   SCIATICA, LEFT 11/21/2007   RENAL VEIN INJURY 11/21/2007   BACK PAIN 05/05/2007   Past Medical History:  Diagnosis Date   Hypertension    No past surgical history on file. Social History   Tobacco Use   Smoking status: Former    Types: Cigarettes    Quit date: 06/09/1989    Years since quitting: 33.2   Smokeless tobacco: Never  Vaping Use   Vaping Use: Never used  Substance Use Topics   Alcohol use: Yes    Alcohol/week: 10.0 standard drinks of alcohol    Types: 10 Glasses of wine per week   Drug use: No   Social History   Socioeconomic History   Marital status: Married    Spouse name: Not on file   Number of children: 2   Years of education: Not on file   Highest education level: Bachelor's degree (e.g., BA, AB, BS)  Occupational History   Occupation: realtor  Tobacco Use   Smoking status: Former    Types: Cigarettes    Quit date: 06/09/1989    Years since quitting: 33.2   Smokeless tobacco: Never  Vaping Use   Vaping Use: Never used  Substance and Sexual Activity   Alcohol use: Yes    Alcohol/week: 10.0 standard drinks of alcohol    Types: 10 Glasses of wine per week   Drug use: No   Sexual activity: Yes    Partners: Male  Other Topics Concern   Not on file  Social History Narrative   Exercise -- off and on-- walking  1 hr 2-3 x a  week   Social Determinants of Health   Financial Resource Strain: Low Risk  (09/09/2022)   Overall Financial Resource Strain (CARDIA)    Difficulty of Paying Living Expenses: Not hard at all  Food Insecurity: No Food Insecurity (09/09/2022)   Hunger Vital Sign    Worried About Running Out of Food in the Last Year: Never true    Ran Out of Food in the Last Year: Never true  Transportation Needs: No Transportation Needs (09/09/2022)   PRAPARE - Administrator, Civil Service (Medical): No    Lack of Transportation (Non-Medical): No  Physical Activity: Insufficiently Active (09/09/2022)   Exercise Vital Sign    Days of Exercise per Week: 1 day    Minutes of Exercise per Session: 30 min  Stress: No Stress Concern Present (09/09/2022)   [employer] of Occupational Health - Occupational Stress Questionnaire    Feeling of Stress : Only a little  Social Connections: Moderately Integrated (09/09/2022)   Social Connection and Isolation Panel [NHANES]    Frequency of Communication with Friends and Family: More than three times a week    Frequency of Social Gatherings with Friends and Family: More  than three times a week    Attends Religious Services: Never    Active Member of Clubs or Organizations: Yes    Attends Engineer, structural: More than 4 times per year    Marital Status: Married  Catering manager Violence: Not on file   Family Status  Relation Name Status   Mother  Deceased at age 72       chf   Father  Deceased at age 64       Urosepsis   Mat Uncle  Alive   Garment/textile technologist Aunt  Deceased at age 20   Oceanographer  (Not Specified)   Family History  Problem Relation Age of Onset   Heart failure Mother    Heart attack Father 59   Hypertension Father    Hyperlipidemia Father    Alcohol abuse Father    Alzheimer's disease Father 35   Cancer Maternal Uncle        skin,     Cancer Paternal Aunt        breast   Breast cancer Paternal Aunt    Breast cancer Paternal  Aunt    Allergies  Allergen Reactions   Iohexol      Desc: pre-med with 50 mg benadryl did ok-mild repsiratory distress'71 with ivp dye    Nitrofurantoin       Review of Systems  Constitutional:  Negative for fever and malaise/fatigue.  HENT:  Negative for congestion.   Eyes:  Negative for blurred vision.  Respiratory:  Negative for shortness of breath.   Cardiovascular:  Negative for chest pain, palpitations and leg swelling.  Gastrointestinal:  Negative for abdominal pain, blood in stool and nausea.  Genitourinary:  Negative for dysuria and frequency.  Musculoskeletal:  Negative for falls.  Skin:  Negative for rash.  Neurological:  Negative for dizziness, loss of consciousness and headaches.  Endo/Heme/Allergies:  Negative for environmental allergies.  Psychiatric/Behavioral:  Negative for depression. The patient is not nervous/anxious.       Objective:     BP 118/82 (BP Location: Right Arm, Patient Position: Sitting, Cuff Size: Normal)   Pulse 72   Temp 98.2 F (36.8 C) (Oral)   Resp 18   Ht 5' 7.5" (1.715 m)   Wt 149 lb 9.6 oz (67.9 kg)   LMP 08/12/2010   SpO2 98%   BMI 23.08 kg/m  BP Readings from Last 3 Encounters:  09/10/22 118/82  03/12/22 100/70  09/18/21 116/78   Wt Readings from Last 3 Encounters:  09/10/22 149 lb 9.6 oz (67.9 kg)  03/12/22 148 lb 12.8 oz (67.5 kg)  09/18/21 149 lb 6.4 oz (67.8 kg)   SpO2 Readings from Last 3 Encounters:  09/10/22 98%  03/12/22 98%  09/18/21 97%      Physical Exam Vitals and nursing note reviewed.  Constitutional:      Appearance: She is well-developed.  HENT:     Head: Normocephalic and atraumatic.  Eyes:     Conjunctiva/sclera: Conjunctivae normal.  Neck:     Thyroid: No thyromegaly.     Vascular: No carotid bruit or JVD.  Cardiovascular:     Rate and Rhythm: Normal rate and regular rhythm.     Heart sounds: Normal heart sounds. No murmur heard. Pulmonary:     Effort: Pulmonary effort is normal. No  respiratory distress.     Breath sounds: Normal breath sounds. No wheezing or rales.  Chest:     Chest wall: No tenderness.  Musculoskeletal:  Cervical back: Normal range of motion and neck supple.  Neurological:     Mental Status: She is alert and oriented to person, place, and time.      No results found for any visits on 09/10/22.  Last CBC Lab Results  Component Value Date   WBC 5.1 03/12/2022   HGB 15.0 03/12/2022   HCT 44.1 03/12/2022   MCV 96.2 03/12/2022   MCH 32.4 03/06/2020   RDW 12.6 03/12/2022   PLT 314.0 03/12/2022   Last metabolic panel Lab Results  Component Value Date   GLUCOSE 89 03/12/2022   NA 135 03/12/2022   K 4.3 03/12/2022   CL 96 03/12/2022   CO2 29 03/12/2022   BUN 13 03/12/2022   CREATININE 0.71 03/12/2022   CALCIUM 9.7 03/12/2022   PROT 7.9 03/12/2022   ALBUMIN 4.7 03/12/2022   BILITOT 0.6 03/12/2022   ALKPHOS 52 03/12/2022   AST 19 03/12/2022   ALT 16 03/12/2022   Last lipids Lab Results  Component Value Date   CHOL 229 (H) 03/12/2022   HDL 92.50 03/12/2022   LDLCALC 124 (H) 03/12/2022   LDLDIRECT 125.6 08/19/2010   TRIG 66.0 03/12/2022   CHOLHDL 2 03/12/2022   Last hemoglobin A1c Lab Results  Component Value Date   HGBA1C 5.6 09/08/2020   Last thyroid functions Lab Results  Component Value Date   TSH 2.38 03/12/2022      The 10-year ASCVD risk score (Arnett DK, et al., 2019) is: 4.3%    Assessment & Plan:   Problem List Items Addressed This Visit       Medium    Essential hypertension - Primary    Well controlled, no changes to meds. Encouraged heart healthy diet such as the DASH diet and exercise as tolerated.        Relevant Orders   CBC with Differential/Platelet   Comprehensive metabolic panel   Lipid panel   Other Visit Diagnoses     Primary hypertension       Relevant Orders   CBC with Differential/Platelet   Comprehensive metabolic panel   Lipid panel       No follow-ups on file.     Donato Schultz, DO

## 2022-09-10 NOTE — Patient Instructions (Signed)

## 2022-09-10 NOTE — Assessment & Plan Note (Signed)
Well controlled, no changes to meds. Encouraged heart healthy diet such as the DASH diet and exercise as tolerated.  °

## 2022-10-04 ENCOUNTER — Other Ambulatory Visit: Payer: Self-pay | Admitting: Family Medicine

## 2022-10-04 DIAGNOSIS — I10 Essential (primary) hypertension: Secondary | ICD-10-CM

## 2022-11-05 ENCOUNTER — Ambulatory Visit
Admission: RE | Admit: 2022-11-05 | Discharge: 2022-11-05 | Disposition: A | Discharge status: Home / Self Care | Payer: 59 | Source: Ambulatory Visit | Attending: Family Medicine | Admitting: Family Medicine

## 2022-11-05 ENCOUNTER — Encounter: Payer: Self-pay | Admitting: Family Medicine

## 2022-11-05 DIAGNOSIS — E2839 Other primary ovarian failure: Secondary | ICD-10-CM

## 2022-11-05 DIAGNOSIS — Z1231 Encounter for screening mammogram for malignant neoplasm of breast: Secondary | ICD-10-CM

## 2022-12-24 ENCOUNTER — Other Ambulatory Visit: Payer: Self-pay | Admitting: Family Medicine

## 2022-12-24 DIAGNOSIS — K219 Gastro-esophageal reflux disease without esophagitis: Secondary | ICD-10-CM

## 2023-03-18 ENCOUNTER — Ambulatory Visit (INDEPENDENT_AMBULATORY_CARE_PROVIDER_SITE_OTHER): Payer: 59 | Admitting: Family Medicine

## 2023-03-18 ENCOUNTER — Encounter: Payer: Self-pay | Admitting: Family Medicine

## 2023-03-18 VITALS — BP 112/82 | HR 73 | Temp 98.5°F | Resp 16 | Ht 67.5 in | Wt 151.8 lb

## 2023-03-18 DIAGNOSIS — Z23 Encounter for immunization: Secondary | ICD-10-CM | POA: Diagnosis not present

## 2023-03-18 DIAGNOSIS — I10 Essential (primary) hypertension: Secondary | ICD-10-CM | POA: Diagnosis not present

## 2023-03-18 DIAGNOSIS — Z0000 Encounter for general adult medical examination without abnormal findings: Secondary | ICD-10-CM

## 2023-03-18 LAB — CBC WITH DIFFERENTIAL/PLATELET
Basophils Absolute: 0 10*3/uL (ref 0.0–0.1)
Basophils Relative: 0.5 % (ref 0.0–3.0)
Eosinophils Absolute: 0.4 10*3/uL (ref 0.0–0.7)
Eosinophils Relative: 7.9 % — ABNORMAL HIGH (ref 0.0–5.0)
HCT: 44 % (ref 36.0–46.0)
Hemoglobin: 14.5 g/dL (ref 12.0–15.0)
Lymphocytes Relative: 38.6 % (ref 12.0–46.0)
Lymphs Abs: 1.9 10*3/uL (ref 0.7–4.0)
MCHC: 32.9 g/dL (ref 30.0–36.0)
MCV: 99.3 fL (ref 78.0–100.0)
Monocytes Absolute: 0.4 10*3/uL (ref 0.1–1.0)
Monocytes Relative: 9.2 % (ref 3.0–12.0)
Neutro Abs: 2.1 10*3/uL (ref 1.4–7.7)
Neutrophils Relative %: 43.8 % (ref 43.0–77.0)
Platelets: 353 10*3/uL (ref 150.0–400.0)
RBC: 4.44 Mil/uL (ref 3.87–5.11)
RDW: 14 % (ref 11.5–15.5)
WBC: 4.8 10*3/uL (ref 4.0–10.5)

## 2023-03-18 LAB — COMPREHENSIVE METABOLIC PANEL
ALT: 16 U/L (ref 0–35)
AST: 21 U/L (ref 0–37)
Albumin: 4.5 g/dL (ref 3.5–5.2)
Alkaline Phosphatase: 51 U/L (ref 39–117)
BUN: 7 mg/dL (ref 6–23)
CO2: 28 meq/L (ref 19–32)
Calcium: 9.4 mg/dL (ref 8.4–10.5)
Chloride: 95 meq/L — ABNORMAL LOW (ref 96–112)
Creatinine, Ser: 0.67 mg/dL (ref 0.40–1.20)
GFR: 92.94 mL/min (ref >60.00)
Glucose, Bld: 86 mg/dL (ref 70–99)
Potassium: 4.3 meq/L (ref 3.5–5.1)
Sodium: 134 meq/L — ABNORMAL LOW (ref 135–145)
Total Bilirubin: 0.6 mg/dL (ref 0.2–1.2)
Total Protein: 7.4 g/dL (ref 6.0–8.3)

## 2023-03-18 LAB — LIPID PANEL
Cholesterol: 218 mg/dL — ABNORMAL HIGH (ref 0–200)
HDL: 93.5 mg/dL (ref >39.00)
LDL Cholesterol: 107 mg/dL — ABNORMAL HIGH (ref 0–99)
NonHDL: 124.03
Total CHOL/HDL Ratio: 2
Triglycerides: 87 mg/dL (ref 0.0–149.0)
VLDL: 17.4 mg/dL (ref 0.0–40.0)

## 2023-03-18 LAB — TSH: TSH: 3.08 u[IU]/mL (ref 0.35–5.50)

## 2023-03-18 NOTE — Assessment & Plan Note (Signed)
Ghm utd Check labs  See [facility] Health Maintenance  Topic Date Due   COVID-19 Vaccine (5 - 2023-24 season) 01/30/2023   Cervical Cancer Screening (HPV/Pap Cotest)  09/09/2023   MAMMOGRAM  11/05/2023   DTaP/Tdap/Td (2 - Td or Tdap) 12/25/2023   Colonoscopy  03/08/2027   INFLUENZA VACCINE  Completed   Hepatitis C Screening  Completed   HIV Screening  Completed   Zoster Vaccines- Shingrix  Completed   HPV VACCINES  Aged Out

## 2023-03-18 NOTE — Patient Instructions (Signed)
Preventive Care 40-64 Years Old, Female Preventive care refers to lifestyle choices and visits with your health care provider that can promote health and wellness. Preventive care visits are also called wellness exams. What can I expect for my preventive care visit? Counseling Your health care provider may ask you questions about your: Medical history, including: Past medical problems. Family medical history. Pregnancy history. Current health, including: Menstrual cycle. Method of birth control. Emotional well-being. Home life and relationship well-being. Sexual activity and sexual health. Lifestyle, including: Alcohol, nicotine or tobacco, and drug use. Access to firearms. Diet, exercise, and sleep habits. Work and work environment. Sunscreen use. Safety issues such as seatbelt and bike helmet use. Physical exam Your health care provider will check your: Height and weight. These may be used to calculate your BMI (body mass index). BMI is a measurement that tells if you are at a healthy weight. Waist circumference. This measures the distance around your waistline. This measurement also tells if you are at a healthy weight and may help predict your risk of certain diseases, such as type 2 diabetes and high blood pressure. Heart rate and blood pressure. Body temperature. Skin for abnormal spots. What immunizations do I need?  Vaccines are usually given at various ages, according to a schedule. Your health care provider will recommend vaccines for you based on your age, medical history, and lifestyle or other factors, such as travel or where you work. What tests do I need? Screening Your health care provider may recommend screening tests for certain conditions. This may include: Lipid and cholesterol levels. Diabetes screening. This is done by checking your blood sugar (glucose) after you have not eaten for a while (fasting). Pelvic exam and Pap test. Hepatitis B test. Hepatitis C  test. HIV (human immunodeficiency virus) test. STI (sexually transmitted infection) testing, if you are at risk. Lung cancer screening. Colorectal cancer screening. Mammogram. Talk with your health care provider about when you should start having regular mammograms. This may depend on whether you have a family history of breast cancer. BRCA-related cancer screening. This may be done if you have a family history of breast, ovarian, tubal, or peritoneal cancers. Bone density scan. This is done to screen for osteoporosis. Talk with your health care provider about your test results, treatment options, and if necessary, the need for more tests. Follow these instructions at home: Eating and drinking  Eat a diet that includes fresh fruits and vegetables, whole grains, lean protein, and low-fat dairy products. Take vitamin and mineral supplements as recommended by your health care provider. Do not drink alcohol if: Your health care provider tells you not to drink. You are pregnant, may be pregnant, or are planning to become pregnant. If you drink alcohol: Limit how much you have to 0-1 drink a day. Know how much alcohol is in your drink. In the [place] one drink equals one 12 oz bottle of beer (355 mL), one 5 oz glass of wine (148 mL), or one 1 oz glass of hard liquor (44 mL). Lifestyle Brush your teeth every morning and night with fluoride toothpaste. Floss one time each day. Exercise for at least 30 minutes 5 or more days each week. Do not use any products that contain nicotine or tobacco. These products include cigarettes, chewing tobacco, and vaping devices, such as e-cigarettes. If you need help quitting, ask your health care provider. Do not use drugs. If you are sexually active, practice safe sex. Use a condom or other form of protection to   prevent STIs. If you do not wish to become pregnant, use a form of birth control. If you plan to become pregnant, see your health care provider for a  prepregnancy visit. Take aspirin only as told by your health care provider. Make sure that you understand how much to take and what form to take. Work with your health care provider to find out whether it is safe and beneficial for you to take aspirin daily. Find healthy ways to manage stress, such as: Meditation, yoga, or listening to music. Journaling. Talking to a trusted person. Spending time with friends and family. Minimize exposure to UV radiation to reduce your risk of skin cancer. Safety Always wear your seat belt while driving or riding in a vehicle. Do not drive: If you have been drinking alcohol. Do not ride with someone who has been drinking. When you are tired or distracted. While texting. If you have been using any mind-altering substances or drugs. Wear a helmet and other protective equipment during sports activities. If you have firearms in your house, make sure you follow all gun safety procedures. Seek help if you have been physically or sexually abused. What's next? Visit your health care provider once a year for an annual wellness visit. Ask your health care provider how often you should have your eyes and teeth checked. Stay up to date on all vaccines. This information is not intended to replace advice given to you by your health care provider. Make sure you discuss any questions you have with your health care provider. Document Revised: 11/12/2020 Document Reviewed: 11/12/2020 Elsevier Patient Education  2024 [employer]  

## 2023-03-18 NOTE — Progress Notes (Signed)
Established Patient Office Visit  Subjective   Patient ID: Tina Petersen, female    DOB: May 30, 1961  Age: 62 y.o. MRN: [id number]  Chief Complaint  Patient presents with   Annual Exam    Pt states fasting     HPI Discussed the use of AI scribe software for clinical note transcription with the patient, who gave verbal consent to proceed.  History of Present Illness   The patient presents for an annual physical. She reports that her asthma has been "pretty good" this year and not any worse than normal. She received a flu shot during the visit and had a COVID vaccine three weeks prior. She has a history of a kidney injury from a sledding accident when she was younger, which required a six-week hospital stay but healed without surgical intervention. She has a family history of heart disease, with both parents having had heart-related issues. The patient has three grandchildren who live nearby and she stays active by chasing them around. She expresses a desire to lose a few pounds.      Patient Active Problem List   Diagnosis Date Noted   Essential hypertension 02/17/2009    Priority: Medium    Palpitations 09/18/2021   SOB (shortness of breath) 09/18/2021   Preventative health care 03/10/2021   Gynecologic exam normal 09/08/2020   Menopause 08/02/2019   Colon polyp 05/17/2017   ALLERGIC RHINITIS, SEASONAL 08/05/2009   Asthma 02/17/2009   GERD 02/17/2009   SCIATICA, LEFT 11/21/2007   RENAL VEIN INJURY 11/21/2007   Backache 05/05/2007   Past Medical History:  Diagnosis Date   Hypertension    History reviewed. No pertinent surgical history. Social History   Tobacco Use   Smoking status: Former    Current packs/day: 0.00    Types: Cigarettes    Quit date: 06/09/1989    Years since quitting: 33.7   Smokeless tobacco: Never  Vaping Use   Vaping status: Never Used  Substance Use Topics   Alcohol use: Yes    Alcohol/week: 10.0 standard drinks of alcohol    Types: 10  Glasses of wine per week   Drug use: No   Social History   Socioeconomic History   Marital status: Married    Spouse name: Not on file   Number of children: 2   Years of education: Not on file   Highest education level: Bachelor's degree (e.g., BA, AB, BS)  Occupational History   Occupation: realtor  Tobacco Use   Smoking status: Former    Current packs/day: 0.00    Types: Cigarettes    Quit date: 06/09/1989    Years since quitting: 33.7   Smokeless tobacco: Never  Vaping Use   Vaping status: Never Used  Substance and Sexual Activity   Alcohol use: Yes    Alcohol/week: 10.0 standard drinks of alcohol    Types: 10 Glasses of wine per week   Drug use: No   Sexual activity: Yes    Partners: Male  Other Topics Concern   Not on file  Social History Narrative   Exercise -- off and on-- walking  1 hr 2-3 x a week   Social Determinants of Health   Financial Resource Strain: Low Risk  (09/09/2022)   Overall Financial Resource Strain (CARDIA)    Difficulty of Paying Living Expenses: Not hard at all  Food Insecurity: No Food Insecurity (09/09/2022)   Hunger Vital Sign    Worried About Running Out of Food  in the Last Year: Never true    Ran Out of Food in the Last Year: Never true  Transportation Needs: No Transportation Needs (09/09/2022)   PRAPARE - Administrator, Civil Service (Medical): No    Lack of Transportation (Non-Medical): No  Physical Activity: Insufficiently Active (09/09/2022)   Exercise Vital Sign    Days of Exercise per Week: 1 day    Minutes of Exercise per Session: 30 min  Stress: No Stress Concern Present (09/09/2022)   [employer] of Occupational Health - Occupational Stress Questionnaire    Feeling of Stress : Only a little  Social Connections: Moderately Integrated (09/09/2022)   Social Connection and Isolation Panel [NHANES]    Frequency of Communication with Friends and Family: More than three times a week    Frequency of Social  Gatherings with Friends and Family: More than three times a week    Attends Religious Services: Never    Database administrator or Organizations: Yes    Attends Engineer, structural: More than 4 times per year    Marital Status: Married  Catering manager Violence: Not on file   Family Status  Relation Name Status   Mother  Deceased at age 61       chf   Father  Deceased at age 86       Urosepsis   Mat Uncle  Alive   Garment/textile technologist Aunt  Deceased at age 54   Oceanographer  (Not Specified)  No partnership data on file   Family History  Problem Relation Age of Onset   Heart failure Mother    Heart attack Father 22   Hypertension Father    Hyperlipidemia Father    Alcohol abuse Father    Alzheimer's disease Father 46   Cancer Maternal Uncle        skin,     Cancer Paternal Aunt        breast   Breast cancer Paternal Aunt    Breast cancer Paternal Aunt    Allergies  Allergen Reactions   Iohexol      Desc: pre-med with 50 mg benadryl did ok-mild repsiratory distress'72 with ivp dye    Nitrofurantoin       Review of Systems  Constitutional:  Negative for chills, fever and malaise/fatigue.  HENT:  Negative for congestion and hearing loss.   Eyes:  Negative for blurred vision and discharge.  Respiratory:  Negative for cough, sputum production and shortness of breath.   Cardiovascular:  Negative for chest pain, palpitations and leg swelling.  Gastrointestinal:  Negative for abdominal pain, blood in stool, constipation, diarrhea, heartburn, nausea and vomiting.  Genitourinary:  Negative for dysuria, frequency, hematuria and urgency.  Musculoskeletal:  Negative for back pain, falls and myalgias.  Skin:  Negative for rash.  Neurological:  Negative for dizziness, sensory change, loss of consciousness, weakness and headaches.  Endo/Heme/Allergies:  Negative for environmental allergies. Does not bruise/bleed easily.  Psychiatric/Behavioral:  Negative for depression and suicidal ideas.  The patient is not nervous/anxious and does not have insomnia.       Objective:     BP 112/82 (BP Location: Left Arm, Patient Position: Sitting, Cuff Size: Normal)   Pulse 73   Temp 98.5 F (36.9 C) (Oral)   Resp 16   Ht 5' 7.5" (1.715 m)   Wt 151 lb 12.8 oz (68.9 kg)   LMP 08/12/2010   SpO2 96%   BMI 23.42 kg/m  BP Readings  from Last 3 Encounters:  03/18/23 112/82  09/10/22 118/82  03/12/22 100/70   Wt Readings from Last 3 Encounters:  03/18/23 151 lb 12.8 oz (68.9 kg)  09/10/22 149 lb 9.6 oz (67.9 kg)  03/12/22 148 lb 12.8 oz (67.5 kg)   SpO2 Readings from Last 3 Encounters:  03/18/23 96%  09/10/22 98%  03/12/22 98%      Physical Exam Vitals and nursing note reviewed.  Constitutional:      General: She is not in acute distress.    Appearance: Normal appearance. She is well-developed.  HENT:     Head: Normocephalic and atraumatic.     Right Ear: Tympanic membrane, ear canal and external ear normal. There is no impacted cerumen.     Left Ear: Tympanic membrane, ear canal and external ear normal. There is no impacted cerumen.     Nose: Nose normal.     Mouth/Throat:     Mouth: Mucous membranes are moist.     Pharynx: Oropharynx is clear. No oropharyngeal exudate or posterior oropharyngeal erythema.  Eyes:     General: No scleral icterus.       Right eye: No discharge.        Left eye: No discharge.     Conjunctiva/sclera: Conjunctivae normal.     Pupils: Pupils are equal, round, and reactive to light.  Neck:     Thyroid: No thyromegaly or thyroid tenderness.     Vascular: No JVD.  Cardiovascular:     Rate and Rhythm: Normal rate and regular rhythm.     Heart sounds: Normal heart sounds. No murmur heard. Pulmonary:     Effort: Pulmonary effort is normal. No respiratory distress.     Breath sounds: Normal breath sounds.  Abdominal:     General: Bowel sounds are normal. There is no distension.     Palpations: Abdomen is soft. There is no mass.      Tenderness: There is no abdominal tenderness. There is no guarding or rebound.  Genitourinary:    Vagina: Normal.  Musculoskeletal:        General: Normal range of motion.     Cervical back: Normal range of motion and neck supple.     Right lower leg: No edema.     Left lower leg: No edema.  Lymphadenopathy:     Cervical: No cervical adenopathy.  Skin:    General: Skin is warm and dry.     Findings: No erythema or rash.  Neurological:     Mental Status: She is alert and oriented to person, place, and time.     Cranial Nerves: No cranial nerve deficit.     Deep Tendon Reflexes: Reflexes are normal and symmetric.  Psychiatric:        Mood and Affect: Mood normal.        Behavior: Behavior normal.        Thought Content: Thought content normal.        Judgment: Judgment normal.      No results found for any visits on 03/18/23.  Last CBC Lab Results  Component Value Date   WBC 5.2 09/10/2022   HGB 14.9 09/10/2022   HCT 44.1 09/10/2022   MCV 97.9 09/10/2022   MCH 32.4 03/06/2020   RDW 13.8 09/10/2022   PLT 291.0 09/10/2022   Last metabolic panel Lab Results  Component Value Date   GLUCOSE 96 09/10/2022   NA 135 09/10/2022   K 4.3 09/10/2022   CL 97 09/10/2022  CO2 28 09/10/2022   BUN 10 09/10/2022   CREATININE 0.76 09/10/2022   GFR 83.63 09/10/2022   CALCIUM 10.0 09/10/2022   PROT 7.8 09/10/2022   ALBUMIN 4.7 09/10/2022   BILITOT 0.8 09/10/2022   ALKPHOS 58 09/10/2022   AST 21 09/10/2022   ALT 17 09/10/2022   Last lipids Lab Results  Component Value Date   CHOL 226 (H) 09/10/2022   HDL 94.20 09/10/2022   LDLCALC 117 (H) 09/10/2022   LDLDIRECT 125.6 08/19/2010   TRIG 70.0 09/10/2022   CHOLHDL 2 09/10/2022   Last hemoglobin A1c Lab Results  Component Value Date   HGBA1C 5.6 09/08/2020   Last thyroid functions Lab Results  Component Value Date   TSH 2.38 03/12/2022   Last vitamin D No results found for: "25OHVITD2", "25OHVITD3", "VD25OH" Last  vitamin B12 and Folate No results found for: "VITAMINB12", "FOLATE"    The 10-year ASCVD risk score (Arnett DK, et al., 2019) is: 3.8%    Assessment & Plan:   Problem List Items Addressed This Visit       Medium    Essential hypertension    Well controlled, no changes to meds. Encouraged heart healthy diet such as the DASH diet and exercise as tolerated.        Relevant Orders   CBC with Differential/Platelet   Comprehensive metabolic panel     Unprioritized   Preventative health care - Primary    Ghm utd Check labs  See AVS Health Maintenance  Topic Date Due   COVID-19 Vaccine (5 - 2023-24 season) 01/30/2023   Cervical Cancer Screening (HPV/Pap Cotest)  09/09/2023   MAMMOGRAM  11/05/2023   DTaP/Tdap/Td (2 - Td or Tdap) 12/25/2023   Colonoscopy  03/08/2027   INFLUENZA VACCINE  Completed   Hepatitis C Screening  Completed   HIV Screening  Completed   Zoster Vaccines- Shingrix  Completed   HPV VACCINES  Aged Out         Relevant Orders   CBC with Differential/Platelet   Comprehensive metabolic panel   Lipid panel   TSH   Other Visit Diagnoses     Need for influenza vaccination       Relevant Orders   Flu vaccine trivalent PF, 6mos and older(Flulaval,Afluria,Fluarix,Fluzone) (Completed)     Assessment and Plan    Asthma Stable, no exacerbations reported. -Continue current management plan.  Vaccinations Flu shot due, COVID vaccination received 3 weeks ago. -Administer flu shot today.  Cancer Screening Mammogram results normal, colonoscopy due in 2028, Pap smear due next year. -Continue current screening schedule.  Family History of Heart Disease Patient expressed concern about recent family members experiencing strokes. Both parents had heart conditions. Discussed options for further evaluation including genetic testing and calcium scoring. -Consider genetic testing and/or calcium scoring for further risk assessment if patient decides to  proceed.  General Health Maintenance Tetanus vaccination due July 2025. -Plan to administer tetanus vaccination in July 2025.        Return in about 1 year (around 03/17/2024), or if symptoms worsen or fail to improve, for annual exam.    Donato Schultz, DO

## 2023-03-18 NOTE — Assessment & Plan Note (Signed)
Well controlled, no changes to meds. Encouraged heart healthy diet such as the DASH diet and exercise as tolerated.  °

## 2023-04-15 ENCOUNTER — Encounter: Payer: Self-pay | Admitting: Family Medicine

## 2023-04-15 DIAGNOSIS — J4520 Mild intermittent asthma, uncomplicated: Secondary | ICD-10-CM

## 2023-04-15 MED ORDER — FLUTICASONE-SALMETEROL 100-50 MCG/ACT IN AEPB: 1.0000 | INHALATION_SPRAY | Freq: Two times a day (BID) | RESPIRATORY_TRACT | 1 refills | Status: DC

## 2023-06-23 ENCOUNTER — Other Ambulatory Visit: Payer: Self-pay | Admitting: Family Medicine

## 2023-06-23 DIAGNOSIS — K219 Gastro-esophageal reflux disease without esophagitis: Secondary | ICD-10-CM

## 2023-09-20 ENCOUNTER — Other Ambulatory Visit: Payer: Self-pay | Admitting: Family Medicine

## 2023-09-20 DIAGNOSIS — I10 Essential (primary) hypertension: Secondary | ICD-10-CM

## 2023-09-30 ENCOUNTER — Other Ambulatory Visit: Payer: Self-pay | Admitting: Family Medicine

## 2023-09-30 DIAGNOSIS — I10 Essential (primary) hypertension: Secondary | ICD-10-CM

## 2023-10-10 ENCOUNTER — Other Ambulatory Visit: Payer: Self-pay | Admitting: Family Medicine

## 2023-10-10 DIAGNOSIS — Z1231 Encounter for screening mammogram for malignant neoplasm of breast: Secondary | ICD-10-CM

## 2023-11-09 ENCOUNTER — Ambulatory Visit
Admission: RE | Admit: 2023-11-09 | Discharge: 2023-11-09 | Disposition: A | Discharge status: Home / Self Care | Source: Ambulatory Visit

## 2023-11-09 DIAGNOSIS — Z1231 Encounter for screening mammogram for malignant neoplasm of breast: Secondary | ICD-10-CM

## 2023-12-14 ENCOUNTER — Other Ambulatory Visit: Payer: Self-pay | Admitting: Family Medicine

## 2023-12-14 DIAGNOSIS — K219 Gastro-esophageal reflux disease without esophagitis: Secondary | ICD-10-CM

## 2024-01-21 ENCOUNTER — Other Ambulatory Visit: Payer: Self-pay | Admitting: Family Medicine

## 2024-01-21 DIAGNOSIS — J4520 Mild intermittent asthma, uncomplicated: Secondary | ICD-10-CM

## 2024-03-23 ENCOUNTER — Encounter: Payer: 59 | Admitting: Family Medicine

## 2024-03-26 ENCOUNTER — Ambulatory Visit: Admitting: Family Medicine

## 2024-03-26 VITALS — BP 130/88 | HR 77 | Temp 98.9°F | Resp 18 | Ht 67.5 in | Wt 149.4 lb

## 2024-03-26 DIAGNOSIS — I10 Essential (primary) hypertension: Secondary | ICD-10-CM

## 2024-03-26 DIAGNOSIS — Z0000 Encounter for general adult medical examination without abnormal findings: Secondary | ICD-10-CM

## 2024-03-26 DIAGNOSIS — Z23 Encounter for immunization: Secondary | ICD-10-CM | POA: Diagnosis not present

## 2024-03-26 LAB — COMPREHENSIVE METABOLIC PANEL WITH GFR
ALT: 20 U/L (ref 0–35)
AST: 22 U/L (ref 0–37)
Albumin: 4.6 g/dL (ref 3.5–5.2)
Alkaline Phosphatase: 53 U/L (ref 39–117)
BUN: 14 mg/dL (ref 6–23)
CO2: 29 meq/L (ref 19–32)
Calcium: 9.7 mg/dL (ref 8.4–10.5)
Chloride: 97 meq/L (ref 96–112)
Creatinine, Ser: 0.68 mg/dL (ref 0.40–1.20)
GFR: 91.94 mL/min (ref >60.00)
Glucose, Bld: 91 mg/dL (ref 70–99)
Potassium: 4.2 meq/L (ref 3.5–5.1)
Sodium: 134 meq/L — ABNORMAL LOW (ref 135–145)
Total Bilirubin: 0.6 mg/dL (ref 0.2–1.2)
Total Protein: 7.5 g/dL (ref 6.0–8.3)

## 2024-03-26 LAB — CBC WITH DIFFERENTIAL/PLATELET
Basophils Absolute: 0 K/uL (ref 0.0–0.1)
Basophils Relative: 0.5 % (ref 0.0–3.0)
Eosinophils Absolute: 0.5 K/uL (ref 0.0–0.7)
Eosinophils Relative: 7.9 % — ABNORMAL HIGH (ref 0.0–5.0)
HCT: 43.2 % (ref 36.0–46.0)
Hemoglobin: 14.6 g/dL (ref 12.0–15.0)
Lymphocytes Relative: 31.4 % (ref 12.0–46.0)
Lymphs Abs: 1.8 K/uL (ref 0.7–4.0)
MCHC: 33.9 g/dL (ref 30.0–36.0)
MCV: 97.7 fl (ref 78.0–100.0)
Monocytes Absolute: 0.5 K/uL (ref 0.1–1.0)
Monocytes Relative: 8 % (ref 3.0–12.0)
Neutro Abs: 3.1 K/uL (ref 1.4–7.7)
Neutrophils Relative %: 52.2 % (ref 43.0–77.0)
Platelets: 334 K/uL (ref 150.0–400.0)
RBC: 4.42 Mil/uL (ref 3.87–5.11)
RDW: 13.2 % (ref 11.5–15.5)
WBC: 5.9 K/uL (ref 4.0–10.5)

## 2024-03-26 LAB — LIPID PANEL
Cholesterol: 225 mg/dL — ABNORMAL HIGH (ref 0–200)
HDL: 94.3 mg/dL (ref >39.00)
LDL Cholesterol: 116 mg/dL — ABNORMAL HIGH (ref 0–99)
NonHDL: 130.63
Total CHOL/HDL Ratio: 2
Triglycerides: 74 mg/dL (ref 0.0–149.0)
VLDL: 14.8 mg/dL (ref 0.0–40.0)

## 2024-03-26 LAB — TSH: TSH: 2.18 u[IU]/mL (ref 0.35–5.50)

## 2024-03-26 NOTE — Assessment & Plan Note (Signed)
 Well controlled, no changes to meds. Encouraged heart healthy diet such as the DASH diet and exercise as tolerated.

## 2024-03-26 NOTE — Progress Notes (Signed)
 Subjective:    Patient ID: Tina Petersen, female    DOB: Jan 22, 1961, 63 y.o.   MRN: [id number]  Chief Complaint  Patient presents with   Annual Exam    Pt states fasting     HPI Patient is in today for cpe.  Discussed the use of AI scribe software for clinical note transcription with the patient, who gave verbal consent to proceed.  History of Present Illness Tina Petersen is a 63 year old female who presents for an annual physical exam.  She does not require any medication refills at this time, as she received a six-month supply of her medications in May, which includes omeprazole , albuterol , generic Advair, and lisinopril . Her lisinopril  supply will run out this month.  She has not received a tetanus shot in the past ten years. She regularly visits her eye doctor, dentist, and dermatologist. Her dermatologist has given her a year off from visits, and she plans to return next year.  She does not routinely check her blood pressure at home and has not noticed any symptoms indicating elevated blood pressure.    Past Medical History:  Diagnosis Date   Hypertension     No past surgical history on file.  Family History  Problem Relation Age of Onset   Heart failure Mother    Heart attack Father 46   Hypertension Father    Hyperlipidemia Father    Alcohol abuse Father    Alzheimer's disease Father 2   Cancer Maternal Uncle        skin,     Breast cancer Paternal Aunt 64 - 69   Breast cancer Paternal Aunt 11 - 65    Social History   Socioeconomic History   Marital status: Married    Spouse name: Not on file   Number of children: 2   Years of education: Not on file   Highest education level: Bachelor's degree (e.g., BA, AB, BS)  Occupational History   Occupation: realtor  Tobacco Use   Smoking status: Former    Current packs/day: 0.00    Types: Cigarettes    Quit date: 06/09/1989    Years since quitting: 34.8   Smokeless tobacco: Never  Vaping Use   Vaping  status: Never Used  Substance and Sexual Activity   Alcohol use: Yes    Alcohol/week: 10.0 standard drinks of alcohol    Types: 10 Glasses of wine per week   Drug use: No   Sexual activity: Yes    Partners: Male  Other Topics Concern   Not on file  Social History Narrative   Exercise -- off and on-- walking  1 hr 2-3 x a week   Social Drivers of Corporate Investment Banker Strain: Low Risk  (03/26/2024)   Overall Financial Resource Strain (CARDIA)    Difficulty of Paying Living Expenses: Not hard at all  Food Insecurity: No Food Insecurity (03/26/2024)   Hunger Vital Sign    Worried About Running Out of Food in the Last Year: Never true    Ran Out of Food in the Last Year: Never true  Transportation Needs: No Transportation Needs (03/26/2024)   PRAPARE - Administrator, Civil Service (Medical): No    Lack of Transportation (Non-Medical): No  Physical Activity: Insufficiently Active (03/26/2024)   Exercise Vital Sign    Days of Exercise per Week: 2 days    Minutes of Exercise per Session: 30 min  Stress: Stress Concern Present (03/26/2024)  [employer] of Occupational Health - Occupational Stress Questionnaire    Feeling of Stress: To some extent  Social Connections: Socially Integrated (03/26/2024)   Social Connection and Isolation Panel    Frequency of Communication with Friends and Family: More than three times a week    Frequency of Social Gatherings with Friends and Family: Three times a week    Attends Religious Services: 1 to 4 times per year    Active Member of Clubs or Organizations: Yes    Attends Banker Meetings: 1 to 4 times per year    Marital Status: Married  Catering Manager Violence: Not on file    Outpatient Medications Prior to Visit  Medication Sig Dispense Refill   albuterol  (VENTOLIN  HFA) 108 (90 Base) MCG/ACT inhaler Inhale 2 puffs into the lungs every 6 (six) hours as needed for wheezing or shortness of breath. 3 each  3   aspirin 81 MG tablet Take 81 mg by mouth daily.     azelastine  (ASTELIN ) 0.1 % nasal spray Place 2 sprays into both nostrils at bedtime as needed for rhinitis. Use in each nostril as directed 30 mL 3   b complex vitamins tablet Take 1 tablet by mouth daily.     Cholecalciferol (VITAMIN D3) 2000 units TABS Take 1 tablet by mouth daily.     fexofenadine (ALLEGRA) 60 MG tablet Take 60 mg by mouth 2 (two) times daily as needed.     fluticasone  (FLONASE ) 50 MCG/ACT nasal spray Place 2 sprays into the nose daily. 48 g 3   fluticasone -salmeterol (WIXELA INHUB) 100-50 MCG/ACT AEPB Inhale 1 puff into the lungs 2 (two) times daily. 180 each 1   Glucosamine-Chondroit-Vit C-Mn (GLUCOSAMINE 1500 COMPLEX PO) Take 1 tablet by mouth daily.     lisinopril -hydrochlorothiazide  (ZESTORETIC ) 10-12.5 MG tablet TAKE 1 TABLET DAILY 90 tablet 1   Multiple Vitamins-Minerals (MULTIVITAMIN WITH MINERALS) tablet Take 1 tablet by mouth daily.     omeprazole  (PRILOSEC) 20 MG capsule Take 1 capsule (20 mg total) by mouth daily. 90 capsule 1   YUVAFEM  10 MCG TABS vaginal tablet INSERT 1 TABLET VAGINALLY WEEKLY 4 tablet 3   No facility-administered medications prior to visit.    Allergies  Allergen Reactions   Iohexol      Desc: pre-med with 50 mg benadryl did ok-mild repsiratory distress'72 with ivp dye    Nitrofurantoin     Review of Systems  Constitutional:  Negative for chills, fever and malaise/fatigue.  HENT:  Negative for congestion and hearing loss.   Eyes:  Negative for blurred vision and discharge.  Respiratory:  Negative for cough, sputum production and shortness of breath.   Cardiovascular:  Negative for chest pain, palpitations and leg swelling.  Gastrointestinal:  Negative for abdominal pain, blood in stool, constipation, diarrhea, heartburn, nausea and vomiting.  Genitourinary:  Negative for dysuria, frequency, hematuria and urgency.  Musculoskeletal:  Negative for back pain, falls and myalgias.   Skin:  Negative for rash.  Neurological:  Negative for dizziness, sensory change, loss of consciousness, weakness and headaches.  Endo/Heme/Allergies:  Negative for environmental allergies. Does not bruise/bleed easily.  Psychiatric/Behavioral:  Negative for depression and suicidal ideas. The patient is not nervous/anxious and does not have insomnia.        Objective:    Physical Exam Vitals and nursing note reviewed.  Constitutional:      General: She is not in acute distress.    Appearance: Normal appearance. She is well-developed.  HENT:  Head: Normocephalic and atraumatic.     Right Ear: Tympanic membrane, ear canal and external ear normal. There is no impacted cerumen.     Left Ear: Tympanic membrane, ear canal and external ear normal. There is no impacted cerumen.     Nose: Nose normal.     Mouth/Throat:     Mouth: Mucous membranes are moist.     Pharynx: Oropharynx is clear. No oropharyngeal exudate or posterior oropharyngeal erythema.  Eyes:     General: No scleral icterus.       Right eye: No discharge.        Left eye: No discharge.     Conjunctiva/sclera: Conjunctivae normal.     Pupils: Pupils are equal, round, and reactive to light.  Neck:     Thyroid : No thyromegaly or thyroid  tenderness.     Vascular: No JVD.  Cardiovascular:     Rate and Rhythm: Normal rate and regular rhythm.     Heart sounds: Normal heart sounds. No murmur heard. Pulmonary:     Effort: Pulmonary effort is normal. No respiratory distress.     Breath sounds: Normal breath sounds.  Abdominal:     General: Bowel sounds are normal. There is no distension.     Palpations: Abdomen is soft. There is no mass.     Tenderness: There is no abdominal tenderness. There is no guarding or rebound.  Musculoskeletal:        General: Normal range of motion.     Cervical back: Normal range of motion and neck supple.     Right lower leg: No edema.     Left lower leg: No edema.  Lymphadenopathy:      Cervical: No cervical adenopathy.  Skin:    General: Skin is warm and dry.     Findings: No erythema or rash.  Neurological:     Mental Status: She is alert and oriented to person, place, and time.     Cranial Nerves: No cranial nerve deficit.     Deep Tendon Reflexes: Reflexes are normal and symmetric.  Psychiatric:        Mood and Affect: Mood normal.        Behavior: Behavior normal.        Thought Content: Thought content normal.        Judgment: Judgment normal.     BP 130/88   Pulse 77   Temp 98.9 F (37.2 C) (Oral)   Resp 18   Ht 5' 7.5 (1.715 m)   Wt 149 lb 6.4 oz (67.8 kg)   LMP 08/12/2010   SpO2 97%   BMI 23.05 kg/m  Wt Readings from Last 3 Encounters:  03/26/24 149 lb 6.4 oz (67.8 kg)  03/18/23 151 lb 12.8 oz (68.9 kg)  09/10/22 149 lb 9.6 oz (67.9 kg)    Diabetic Foot Exam - Simple   No data filed    Lab Results  Component Value Date   WBC 4.8 03/18/2023   HGB 14.5 03/18/2023   HCT 44.0 03/18/2023   PLT 353.0 03/18/2023   GLUCOSE 86 03/18/2023   CHOL 218 (H) 03/18/2023   TRIG 87.0 03/18/2023   HDL 93.50 03/18/2023   LDLDIRECT 125.6 08/19/2010   LDLCALC 107 (H) 03/18/2023   ALT 16 03/18/2023   AST 21 03/18/2023   NA 134 (L) 03/18/2023   K 4.3 03/18/2023   CL 95 (L) 03/18/2023   CREATININE 0.67 03/18/2023   BUN 7 03/18/2023   CO2 28 03/18/2023  TSH 3.08 03/18/2023   HGBA1C 5.6 09/08/2020    Lab Results  Component Value Date   TSH 3.08 03/18/2023   Lab Results  Component Value Date   WBC 4.8 03/18/2023   HGB 14.5 03/18/2023   HCT 44.0 03/18/2023   MCV 99.3 03/18/2023   PLT 353.0 03/18/2023   Lab Results  Component Value Date   NA 134 (L) 03/18/2023   K 4.3 03/18/2023   CO2 28 03/18/2023   GLUCOSE 86 03/18/2023   BUN 7 03/18/2023   CREATININE 0.67 03/18/2023   BILITOT 0.6 03/18/2023   ALKPHOS 51 03/18/2023   AST 21 03/18/2023   ALT 16 03/18/2023   PROT 7.4 03/18/2023   ALBUMIN 4.5 03/18/2023   CALCIUM 9.4 03/18/2023    GFR 92.94 03/18/2023   Lab Results  Component Value Date   CHOL 218 (H) 03/18/2023   Lab Results  Component Value Date   HDL 93.50 03/18/2023   Lab Results  Component Value Date   LDLCALC 107 (H) 03/18/2023   Lab Results  Component Value Date   TRIG 87.0 03/18/2023   Lab Results  Component Value Date   CHOLHDL 2 03/18/2023   Lab Results  Component Value Date   HGBA1C 5.6 09/08/2020       Assessment & Plan:  Essential hypertension Assessment & Plan: Well controlled, no changes to meds. Encouraged heart healthy diet such as the DASH diet and exercise as tolerated.    Orders: -     CBC with Differential/Platelet -     Comprehensive metabolic panel with GFR -     Lipid panel -     TSH  Preventative health care -     CBC with Differential/Platelet -     Comprehensive metabolic panel with GFR -     Lipid panel -     TSH  Need for influenza vaccination -     Flu vaccine trivalent PF, 6mos and older(Flulaval,Afluria,Fluarix,Fluzone)  Need for Tdap vaccination -     Tdap vaccine greater than or equal to 7yo IM   Assessment and Plan Assessment & Plan Adult Wellness Visit   Adult wellness visit with slightly elevated blood pressure. Administer tetanus booster and recheck blood pressure.  Essential hypertension   Blood pressure is slightly elevated. She does not regularly monitor blood pressure at home and reports no symptoms of hypertension. Recheck blood pressure.  General Health Maintenance   Vaccinations and screenings discussed. She is up to date with eye and dental check-ups and regular dermatologist visits. Tetanus booster is overdue. Discussed COVID and pneumonia vaccines. Administer tetanus booster. Offer COVID vaccine at pharmacy or downstairs. Offer pneumonia vaccine at the clinic or pharmacy.  +  . Atisha Hamidi R Lowne Chase, DO

## 2024-03-26 NOTE — Assessment & Plan Note (Signed)
 Ghm utd Check labs  See AVS Health Maintenance  Topic Date Due   Pneumococcal Vaccine: 50+ Years (1 of 2 - PCV) Never done   COVID-19 Vaccine (5 - 2025-26 season) 01/30/2024   Cervical Cancer Screening (HPV/Pap Cotest)  06/26/2024 (Originally 09/09/2023)   Mammogram  11/08/2024   Colonoscopy  03/08/2027   DTaP/Tdap/Td (3 - Td or Tdap) 03/26/2034   Influenza Vaccine  Completed   Hepatitis C Screening  Completed   HIV Screening  Completed   Zoster Vaccines- Shingrix   Completed   Hepatitis B Vaccines 19-59 Average Risk  Aged Out   HPV VACCINES  Aged Out   Meningococcal B Vaccine  Aged Out

## 2024-03-28 ENCOUNTER — Ambulatory Visit: Payer: Self-pay | Admitting: Family Medicine

## 2024-06-03 ENCOUNTER — Other Ambulatory Visit: Payer: Self-pay | Admitting: Family Medicine

## 2024-06-03 DIAGNOSIS — I10 Essential (primary) hypertension: Secondary | ICD-10-CM

## 2024-06-08 ENCOUNTER — Other Ambulatory Visit: Payer: Self-pay | Admitting: Family Medicine

## 2024-06-08 DIAGNOSIS — K219 Gastro-esophageal reflux disease without esophagitis: Secondary | ICD-10-CM
